# Patient Record
Sex: Female | Born: 1950 | Race: White | Hispanic: No
Health system: Southern US, Community
[De-identification: ages and names within clinical notes are randomized; demographics above are authoritative.]

## PROBLEM LIST (undated history)

## (undated) DIAGNOSIS — K7581 Nonalcoholic steatohepatitis (NASH): Secondary | ICD-10-CM

## (undated) DIAGNOSIS — I482 Chronic atrial fibrillation, unspecified: Secondary | ICD-10-CM

## (undated) DIAGNOSIS — J9621 Acute and chronic respiratory failure with hypoxia: Secondary | ICD-10-CM

## (undated) DIAGNOSIS — G9341 Metabolic encephalopathy: Secondary | ICD-10-CM

---

## 2020-07-19 ENCOUNTER — Ambulatory Visit (HOSPITAL_COMMUNITY)
Admission: AD | Admit: 2020-07-19 | Discharge: 2020-07-19 | Disposition: A | Discharge status: Home / Self Care | Payer: Medicare Other | Source: Other Acute Inpatient Hospital | Attending: Internal Medicine | Admitting: Internal Medicine

## 2020-07-19 ENCOUNTER — Other Ambulatory Visit (HOSPITAL_COMMUNITY): Payer: Medicare Other

## 2020-07-19 ENCOUNTER — Inpatient Hospital Stay
Admission: RE | Admit: 2020-07-19 | Discharge: 2020-08-03 | Disposition: A | Discharge status: Other Acute Inpatient Hospital | Payer: Medicare Other | Source: Other Acute Inpatient Hospital | Attending: Internal Medicine | Admitting: Internal Medicine

## 2020-07-19 DIAGNOSIS — Z452 Encounter for adjustment and management of vascular access device: Secondary | ICD-10-CM

## 2020-07-19 DIAGNOSIS — J9621 Acute and chronic respiratory failure with hypoxia: Secondary | ICD-10-CM | POA: Diagnosis present

## 2020-07-19 DIAGNOSIS — J9690 Respiratory failure, unspecified, unspecified whether with hypoxia or hypercapnia: Secondary | ICD-10-CM

## 2020-07-19 DIAGNOSIS — I482 Chronic atrial fibrillation, unspecified: Secondary | ICD-10-CM | POA: Diagnosis present

## 2020-07-19 DIAGNOSIS — D72829 Elevated white blood cell count, unspecified: Secondary | ICD-10-CM

## 2020-07-19 DIAGNOSIS — Z9289 Personal history of other medical treatment: Secondary | ICD-10-CM

## 2020-07-19 DIAGNOSIS — G9341 Metabolic encephalopathy: Secondary | ICD-10-CM | POA: Diagnosis present

## 2020-07-19 DIAGNOSIS — Z4659 Encounter for fitting and adjustment of other gastrointestinal appliance and device: Secondary | ICD-10-CM

## 2020-07-19 DIAGNOSIS — J189 Pneumonia, unspecified organism: Secondary | ICD-10-CM

## 2020-07-19 DIAGNOSIS — K7581 Nonalcoholic steatohepatitis (NASH): Secondary | ICD-10-CM | POA: Diagnosis present

## 2020-07-19 HISTORY — DX: Chronic atrial fibrillation, unspecified: I48.20

## 2020-07-19 HISTORY — DX: Acute and chronic respiratory failure with hypoxia: J96.21

## 2020-07-19 HISTORY — DX: Nonalcoholic steatohepatitis (NASH): K75.81

## 2020-07-19 HISTORY — DX: Metabolic encephalopathy: G93.41

## 2020-07-20 DIAGNOSIS — K7581 Nonalcoholic steatohepatitis (NASH): Secondary | ICD-10-CM | POA: Diagnosis not present

## 2020-07-20 DIAGNOSIS — J9621 Acute and chronic respiratory failure with hypoxia: Secondary | ICD-10-CM | POA: Diagnosis not present

## 2020-07-20 DIAGNOSIS — I482 Chronic atrial fibrillation, unspecified: Secondary | ICD-10-CM

## 2020-07-20 DIAGNOSIS — G9341 Metabolic encephalopathy: Secondary | ICD-10-CM

## 2020-07-20 LAB — BLOOD GAS, ARTERIAL
Acid-Base Excess: 9.5 mmol/L — ABNORMAL HIGH (ref 0.0–2.0)
Bicarbonate: 33.8 mmol/L — ABNORMAL HIGH (ref 20.0–28.0)
FIO2: 28
O2 Saturation: 96.5 %
Patient temperature: 37
pCO2 arterial: 48.8 mmHg — ABNORMAL HIGH (ref 32.0–48.0)
pH, Arterial: 7.455 — ABNORMAL HIGH (ref 7.350–7.450)
pO2, Arterial: 80.9 mmHg — ABNORMAL LOW (ref 83.0–108.0)

## 2020-07-20 LAB — CBC
HCT: 36.6 % (ref 36.0–46.0)
Hemoglobin: 10.9 g/dL — ABNORMAL LOW (ref 12.0–15.0)
MCH: 28.1 pg (ref 26.0–34.0)
MCHC: 29.8 g/dL — ABNORMAL LOW (ref 30.0–36.0)
MCV: 94.3 fL (ref 80.0–100.0)
Platelets: 82 10*3/uL — ABNORMAL LOW (ref 150–400)
RBC: 3.88 MIL/uL (ref 3.87–5.11)
RDW: 18.8 % — ABNORMAL HIGH (ref 11.5–15.5)
WBC: 5.9 10*3/uL (ref 4.0–10.5)
nRBC: 0 % (ref 0.0–0.2)

## 2020-07-20 LAB — COMPREHENSIVE METABOLIC PANEL
ALT: 98 U/L — ABNORMAL HIGH (ref 0–44)
AST: 121 U/L — ABNORMAL HIGH (ref 15–41)
Albumin: 2.6 g/dL — ABNORMAL LOW (ref 3.5–5.0)
Alkaline Phosphatase: 165 U/L — ABNORMAL HIGH (ref 38–126)
Anion gap: 10 (ref 5–15)
BUN: 37 mg/dL — ABNORMAL HIGH (ref 8–23)
CO2: 30 mmol/L (ref 22–32)
Calcium: 8.9 mg/dL (ref 8.9–10.3)
Chloride: 108 mmol/L (ref 98–111)
Creatinine, Ser: 0.91 mg/dL (ref 0.44–1.00)
GFR calc Af Amer: >60 mL/min (ref >60)
GFR calc non Af Amer: >60 mL/min (ref >60)
Glucose, Bld: 323 mg/dL — ABNORMAL HIGH (ref 70–99)
Potassium: 3.9 mmol/L (ref 3.5–5.1)
Sodium: 148 mmol/L — ABNORMAL HIGH (ref 135–145)
Total Bilirubin: 1.4 mg/dL — ABNORMAL HIGH (ref 0.3–1.2)
Total Protein: 5.2 g/dL — ABNORMAL LOW (ref 6.5–8.1)

## 2020-07-20 LAB — AMMONIA: Ammonia: 63 umol/L — ABNORMAL HIGH (ref 9–35)

## 2020-07-20 LAB — MAGNESIUM: Magnesium: 1.7 mg/dL (ref 1.7–2.4)

## 2020-07-21 ENCOUNTER — Encounter: Payer: Self-pay | Admitting: Internal Medicine

## 2020-07-21 DIAGNOSIS — K7581 Nonalcoholic steatohepatitis (NASH): Secondary | ICD-10-CM | POA: Diagnosis present

## 2020-07-21 DIAGNOSIS — I482 Chronic atrial fibrillation, unspecified: Secondary | ICD-10-CM | POA: Diagnosis present

## 2020-07-21 DIAGNOSIS — G9341 Metabolic encephalopathy: Secondary | ICD-10-CM | POA: Diagnosis not present

## 2020-07-21 DIAGNOSIS — J9621 Acute and chronic respiratory failure with hypoxia: Secondary | ICD-10-CM | POA: Diagnosis not present

## 2020-07-21 LAB — BASIC METABOLIC PANEL
Anion gap: 10 (ref 5–15)
BUN: 35 mg/dL — ABNORMAL HIGH (ref 8–23)
CO2: 34 mmol/L — ABNORMAL HIGH (ref 22–32)
Calcium: 9.1 mg/dL (ref 8.9–10.3)
Chloride: 106 mmol/L (ref 98–111)
Creatinine, Ser: 1.02 mg/dL — ABNORMAL HIGH (ref 0.44–1.00)
GFR calc Af Amer: >60 mL/min (ref >60)
GFR calc non Af Amer: 55 mL/min — ABNORMAL LOW (ref >60)
Glucose, Bld: 309 mg/dL — ABNORMAL HIGH (ref 70–99)
Potassium: 3.3 mmol/L — ABNORMAL LOW (ref 3.5–5.1)
Sodium: 150 mmol/L — ABNORMAL HIGH (ref 135–145)

## 2020-07-21 LAB — URINALYSIS, ROUTINE W REFLEX MICROSCOPIC
Bacteria, UA: NONE SEEN
Bilirubin Urine: NEGATIVE
Glucose, UA: 50 mg/dL — AB
Hgb urine dipstick: NEGATIVE
Ketones, ur: NEGATIVE mg/dL
Nitrite: NEGATIVE
Protein, ur: 100 mg/dL — AB
Specific Gravity, Urine: 1.018 (ref 1.005–1.030)
pH: 5 (ref 5.0–8.0)

## 2020-07-21 LAB — CBC
HCT: 38.7 % (ref 36.0–46.0)
Hemoglobin: 11.2 g/dL — ABNORMAL LOW (ref 12.0–15.0)
MCH: 27.4 pg (ref 26.0–34.0)
MCHC: 28.9 g/dL — ABNORMAL LOW (ref 30.0–36.0)
MCV: 94.6 fL (ref 80.0–100.0)
Platelets: 187 10*3/uL (ref 150–400)
RBC: 4.09 MIL/uL (ref 3.87–5.11)
RDW: 19.1 % — ABNORMAL HIGH (ref 11.5–15.5)
WBC: 11.7 10*3/uL — ABNORMAL HIGH (ref 4.0–10.5)
nRBC: 0.6 % — ABNORMAL HIGH (ref 0.0–0.2)

## 2020-07-21 LAB — MAGNESIUM: Magnesium: 1.9 mg/dL (ref 1.7–2.4)

## 2020-07-21 LAB — TSH: TSH: 1.126 u[IU]/mL (ref 0.350–4.500)

## 2020-07-21 NOTE — Progress Notes (Signed)
Pulmonary Critical Care Medicine [facility] [place]   PULMONARY CRITICAL CARE SERVICE  PROGRESS NOTE  Date of Service: 07/21/2020  Shondrea Steinert  ZJQ:[id number]  DOB: 09/07/1951   DOA: 07/19/2020  Referring Physician: [name], MD  HPI: Alahna Dunne is a 69 y.o. female seen for follow up of Acute on Chronic Respiratory Failure.  Patient at this time is on full support has had fever so therefore weaning has been on hold  Medications: Reviewed on Rounds  Physical Exam:  Vitals: Temperature is 98.3 pulse 103 respiratory 21 blood pressure is 127/74 saturations 100%  Ventilator Settings on assist control FiO2 is 28% tidal line is 410 PEEP 5  . General: Comfortable at this time . Eyes: Grossly normal lids, irises & conjunctiva . ENT: grossly tongue is normal . Neck: no obvious mass . Cardiovascular: S1 S2 normal no gallop . Respiratory: Scattered rhonchi very coarse breath sounds . Abdomen: soft . Skin: no rash seen on limited exam . Musculoskeletal: not rigid . Psychiatric:unable to assess . Neurologic: no seizure no involuntary movements         Lab Data:   Basic Metabolic Panel: Recent Labs  Lab 07/20/20 1349 07/21/20 0954  NA 148* 150*  K 3.9 3.3*  CL 108 106  CO2 30 34*  GLUCOSE 323* 309*  BUN 37* 35*  CREATININE 0.91 1.02*  CALCIUM 8.9 9.1  MG 1.7 1.9    ABG: Recent Labs  Lab 07/20/20 0058  PHART 7.455*  PCO2ART 48.8*  PO2ART 80.9*  HCO3 33.8*  O2SAT 96.5    Liver Function Tests: Recent Labs  Lab 07/20/20 1349  AST 121*  ALT 98*  ALKPHOS 165*  BILITOT 1.4*  PROT 5.2*  ALBUMIN 2.6*   No results for input(s): LIPASE, AMYLASE in the last 168 hours. Recent Labs  Lab 07/20/20 1349  AMMONIA 63*    CBC: Recent Labs  Lab 07/20/20 1544 07/21/20 0954  WBC 5.9 11.7*  HGB 10.9* 11.2*  HCT 36.6 38.7  MCV 94.3 94.6  PLT 82* 187    Cardiac Enzymes: No results for input(s): CKTOTAL, CKMB, CKMBINDEX, TROPONINI in the last 168  hours.  BNP (last 3 results) No results for input(s): BNP in the last 8760 hours.  ProBNP (last 3 results) No results for input(s): PROBNP in the last 8760 hours.  Radiological Exams: DG CHEST PORT 1 VIEW  Result Date: 07/19/2020 CLINICAL DATA:  History of endotracheal tube. EXAM: PORTABLE CHEST 1 VIEW COMPARISON:  Radiograph 07/17/2020 FINDINGS: Endotracheal tube tip 2 cm from the carina. Tip and side port of the enteric tube below the diaphragm in the stomach. Elevation of right hemidiaphragm appears similar to prior exam. Patchy and hazy bilateral lung base opacities may represent atelectasis, pneumonia, pleural effusion or combination there of. Stable heart size and mediastinal contours. Aortic tortuosity. No pneumothorax. Multiple overlying monitoring devices. IMPRESSION: 1. Endotracheal tube tip 2 cm from the carina. Enteric tube in place. 2. Patchy and hazy bilateral lung base opacities may represent atelectasis, pneumonia, pleural effusion or combination of there of. Electronically Signed   By: Narda Rutherford M.D.   On: 07/19/2020 23:54   DG Abd Portable 1V  Result Date: 07/19/2020 CLINICAL DATA:  Nasogastric tube placement. EXAM: PORTABLE ABDOMEN - 1 VIEW COMPARISON:  Radiograph 07/09/2020 FINDINGS: Tip and side port of the enteric tube below the diaphragm in the stomach. There is gaseous distension of transverse colon. No evidence of free air. IMPRESSION: Tip and side port of the enteric tube  below the diaphragm in the stomach. Mild gaseous distension of transverse colon. Electronically Signed   By: Narda Rutherford M.D.   On: 07/19/2020 23:52    Assessment/Plan Active Problems:   Acute on chronic respiratory failure with hypoxia (HCC)   Chronic atrial fibrillation (HCC)   Metabolic encephalopathy   NASH (nonalcoholic steatohepatitis)   1. Acute on chronic respiratory failure hypoxia we will continue with full support on assist control Currently is on 28% FiO2 with a PEEP of 5  patient scheduled fevers are therefore will be holding weaning today 2. Chronic atrial fibrillation rate controlled 3. Metabolic encephalopathy no change we will continue to follow 4. Nash at baseline we will monitor   I have personally seen and evaluated the patient, evaluated laboratory and imaging results, formulated the assessment and plan and placed orders. The Patient requires high complexity decision making with multiple systems involvement.  Rounds were done with the Respiratory Therapy Director and Staff therapists and discussed with nursing staff also.  Yevonne Pax, MD [facility] Pulmonary Critical Care Medicine Sleep Medicine
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
## 2020-07-22 DIAGNOSIS — J9621 Acute and chronic respiratory failure with hypoxia: Secondary | ICD-10-CM | POA: Diagnosis not present

## 2020-07-22 DIAGNOSIS — I482 Chronic atrial fibrillation, unspecified: Secondary | ICD-10-CM | POA: Diagnosis not present

## 2020-07-22 DIAGNOSIS — K7581 Nonalcoholic steatohepatitis (NASH): Secondary | ICD-10-CM | POA: Diagnosis not present

## 2020-07-22 DIAGNOSIS — G9341 Metabolic encephalopathy: Secondary | ICD-10-CM | POA: Diagnosis not present

## 2020-07-22 LAB — BASIC METABOLIC PANEL
Anion gap: 7 (ref 5–15)
BUN: 36 mg/dL — ABNORMAL HIGH (ref 8–23)
CO2: 29 mmol/L (ref 22–32)
Calcium: 7.6 mg/dL — ABNORMAL LOW (ref 8.9–10.3)
Chloride: 111 mmol/L (ref 98–111)
Creatinine, Ser: 1.06 mg/dL — ABNORMAL HIGH (ref 0.44–1.00)
GFR calc Af Amer: >60 mL/min (ref >60)
GFR calc non Af Amer: 53 mL/min — ABNORMAL LOW (ref >60)
Glucose, Bld: 314 mg/dL — ABNORMAL HIGH (ref 70–99)
Potassium: 5.2 mmol/L — ABNORMAL HIGH (ref 3.5–5.1)
Sodium: 147 mmol/L — ABNORMAL HIGH (ref 135–145)

## 2020-07-22 NOTE — Progress Notes (Signed)
Pulmonary Critical Care Medicine [facility] [place]   PULMONARY CRITICAL CARE SERVICE  PROGRESS NOTE  Date of Service: 07/22/2020  Emily Ho  FBP:[id number]  DOB: 1951-10-17   DOA: 07/19/2020  Referring Physician: [name], MD  HPI: Emily Ho is a 69 y.o. female seen for follow up of Acute on Chronic Respiratory Failure.  Currently is on assist control mode has been on 28% FiO2 with good saturations  Medications: Reviewed on Rounds  Physical Exam:  Vitals: Temperature is 98.8 pulse 120 respiratory rate is 16 blood pressure is 142/65 saturations 96%  Ventilator Settings patient currently is on assist control FiO2 28% tidal volume 410 PEEP 5  . General: Comfortable at this time . Eyes: Grossly normal lids, irises & conjunctiva . ENT: grossly tongue is normal . Neck: no obvious mass . Cardiovascular: S1 S2 normal no gallop . Respiratory: No rhonchi very coarse breath sounds . Abdomen: soft . Skin: no rash seen on limited exam . Musculoskeletal: not rigid . Psychiatric:unable to assess . Neurologic: no seizure no involuntary movements         Lab Data:   Basic Metabolic Panel: Recent Labs  Lab 07/20/20 1349 07/21/20 0954  NA 148* 150*  K 3.9 3.3*  CL 108 106  CO2 30 34*  GLUCOSE 323* 309*  BUN 37* 35*  CREATININE 0.91 1.02*  CALCIUM 8.9 9.1  MG 1.7 1.9    ABG: Recent Labs  Lab 07/20/20 0058  PHART 7.455*  PCO2ART 48.8*  PO2ART 80.9*  HCO3 33.8*  O2SAT 96.5    Liver Function Tests: Recent Labs  Lab 07/20/20 1349  AST 121*  ALT 98*  ALKPHOS 165*  BILITOT 1.4*  PROT 5.2*  ALBUMIN 2.6*   No results for input(s): LIPASE, AMYLASE in the last 168 hours. Recent Labs  Lab 07/20/20 1349  AMMONIA 63*    CBC: Recent Labs  Lab 07/20/20 1544 07/21/20 0954  WBC 5.9 11.7*  HGB 10.9* 11.2*  HCT 36.6 38.7  MCV 94.3 94.6  PLT 82* 187    Cardiac Enzymes: No results for input(s): CKTOTAL, CKMB, CKMBINDEX, TROPONINI in the last  168 hours.  BNP (last 3 results) No results for input(s): BNP in the last 8760 hours.  ProBNP (last 3 results) No results for input(s): PROBNP in the last 8760 hours.  Radiological Exams: No results found.  Assessment/Plan Active Problems:   Acute on chronic respiratory failure with hypoxia (HCC)   Chronic atrial fibrillation (HCC)   Metabolic encephalopathy   NASH (nonalcoholic steatohepatitis)   1. Acute on chronic respiratory failure hypoxia we will continue with full support on assist control titrate oxygen continue pulmonary toilet. 2. Chronic atrial fibrillation rate is controlled at this time 3. Metabolic encephalopathy no change 4. Emily Ho -Supportive care   I have personally seen and evaluated the patient, evaluated laboratory and imaging results, formulated the assessment and plan and placed orders. The Patient requires high complexity decision making with multiple systems involvement.  Rounds were done with the Respiratory Therapy Director and Staff therapists and discussed with nursing staff also.  Yevonne Pax, MD [facility] Pulmonary Critical Care Medicine Sleep Medicine

## 2020-07-23 DIAGNOSIS — I482 Chronic atrial fibrillation, unspecified: Secondary | ICD-10-CM | POA: Diagnosis not present

## 2020-07-23 DIAGNOSIS — K7581 Nonalcoholic steatohepatitis (NASH): Secondary | ICD-10-CM | POA: Diagnosis not present

## 2020-07-23 DIAGNOSIS — J9621 Acute and chronic respiratory failure with hypoxia: Secondary | ICD-10-CM | POA: Diagnosis not present

## 2020-07-23 DIAGNOSIS — G9341 Metabolic encephalopathy: Secondary | ICD-10-CM | POA: Diagnosis not present

## 2020-07-23 LAB — BASIC METABOLIC PANEL
Anion gap: 8 (ref 5–15)
BUN: 34 mg/dL — ABNORMAL HIGH (ref 8–23)
CO2: 31 mmol/L (ref 22–32)
Calcium: 8.4 mg/dL — ABNORMAL LOW (ref 8.9–10.3)
Chloride: 107 mmol/L (ref 98–111)
Creatinine, Ser: 0.96 mg/dL (ref 0.44–1.00)
GFR calc Af Amer: >60 mL/min (ref >60)
GFR calc non Af Amer: 60 mL/min — ABNORMAL LOW (ref >60)
Glucose, Bld: 327 mg/dL — ABNORMAL HIGH (ref 70–99)
Potassium: 4.3 mmol/L (ref 3.5–5.1)
Sodium: 146 mmol/L — ABNORMAL HIGH (ref 135–145)

## 2020-07-23 LAB — CBC
HCT: 33.7 % — ABNORMAL LOW (ref 36.0–46.0)
Hemoglobin: 9.6 g/dL — ABNORMAL LOW (ref 12.0–15.0)
MCH: 27 pg (ref 26.0–34.0)
MCHC: 28.5 g/dL — ABNORMAL LOW (ref 30.0–36.0)
MCV: 94.9 fL (ref 80.0–100.0)
Platelets: 130 10*3/uL — ABNORMAL LOW (ref 150–400)
RBC: 3.55 MIL/uL — ABNORMAL LOW (ref 3.87–5.11)
RDW: 19 % — ABNORMAL HIGH (ref 11.5–15.5)
WBC: 10.4 10*3/uL (ref 4.0–10.5)
nRBC: 0.5 % — ABNORMAL HIGH (ref 0.0–0.2)

## 2020-07-23 NOTE — Progress Notes (Signed)
Pulmonary Critical Care Medicine [facility] [place]   PULMONARY CRITICAL CARE SERVICE  PROGRESS NOTE  Date of Service: 07/23/2020  Emily Ho  SJG:[id number]  DOB: December 10, 1950   DOA: 07/19/2020  Referring Physician: [name], MD  HPI: Emily Ho is a 69 y.o. female seen for follow up of Acute on Chronic Respiratory Failure.  Patient currently is on assist control has been on 28% FiO2 with good saturations secretions have been fairly moderate  Medications: Reviewed on Rounds  Physical Exam:  Vitals: Temperature is 98.8 pulse 138 respiratory rate is 18 blood pressure is 186/88 saturations 98%  Ventilator Settings on assist control FiO2 28% tidal volume is 410 PEEP 5  . General: Comfortable at this time . Eyes: Grossly normal lids, irises & conjunctiva . ENT: grossly tongue is normal . Neck: no obvious mass . Cardiovascular: S1 S2 normal no gallop . Respiratory: No rhonchi no rales are noted at this time . Abdomen: soft . Skin: no rash seen on limited exam . Musculoskeletal: not rigid . Psychiatric:unable to assess . Neurologic: no seizure no involuntary movements         Lab Data:   Basic Metabolic Panel: Recent Labs  Lab 07/20/20 1349 07/21/20 0954 07/22/20 1844  NA 148* 150* 147*  K 3.9 3.3* 5.2*  CL 108 106 111  CO2 30 34* 29  GLUCOSE 323* 309* 314*  BUN 37* 35* 36*  CREATININE 0.91 1.02* 1.06*  CALCIUM 8.9 9.1 7.6*  MG 1.7 1.9  --     ABG: Recent Labs  Lab 07/20/20 0058  PHART 7.455*  PCO2ART 48.8*  PO2ART 80.9*  HCO3 33.8*  O2SAT 96.5    Liver Function Tests: Recent Labs  Lab 07/20/20 1349  AST 121*  ALT 98*  ALKPHOS 165*  BILITOT 1.4*  PROT 5.2*  ALBUMIN 2.6*   No results for input(s): LIPASE, AMYLASE in the last 168 hours. Recent Labs  Lab 07/20/20 1349  AMMONIA 63*    CBC: Recent Labs  Lab 07/20/20 1544 07/21/20 0954  WBC 5.9 11.7*  HGB 10.9* 11.2*  HCT 36.6 38.7  MCV 94.3 94.6  PLT 82* 187    Cardiac  Enzymes: No results for input(s): CKTOTAL, CKMB, CKMBINDEX, TROPONINI in the last 168 hours.  BNP (last 3 results) No results for input(s): BNP in the last 8760 hours.  ProBNP (last 3 results) No results for input(s): PROBNP in the last 8760 hours.  Radiological Exams: No results found.  Assessment/Plan Active Problems:   Acute on chronic respiratory failure with hypoxia (HCC)   Chronic atrial fibrillation (HCC)   Metabolic encephalopathy   NASH (nonalcoholic steatohepatitis)   1. Acute on chronic respiratory failure with hypoxia we will continue to assess wean readiness.  Check RSB I mechanics and try to wean today. 2. Chronic atrial fibrillation rate is controlled and I will continue to monitor 3. Metabolic encephalopathy no change 4. -Emily Ho continue with supportive care   I have personally seen and evaluated the patient, evaluated laboratory and imaging results, formulated the assessment and plan and placed orders. The Patient requires high complexity decision making with multiple systems involvement.  Rounds were done with the Respiratory Therapy Director and Staff therapists and discussed with nursing staff also.  Yevonne Pax, MD [facility] Pulmonary Critical Care Medicine Sleep Medicine

## 2020-07-24 DIAGNOSIS — G9341 Metabolic encephalopathy: Secondary | ICD-10-CM | POA: Diagnosis not present

## 2020-07-24 DIAGNOSIS — J9621 Acute and chronic respiratory failure with hypoxia: Secondary | ICD-10-CM | POA: Diagnosis not present

## 2020-07-24 DIAGNOSIS — K7581 Nonalcoholic steatohepatitis (NASH): Secondary | ICD-10-CM | POA: Diagnosis not present

## 2020-07-24 DIAGNOSIS — I482 Chronic atrial fibrillation, unspecified: Secondary | ICD-10-CM | POA: Diagnosis not present

## 2020-07-24 LAB — BASIC METABOLIC PANEL
Anion gap: 7 (ref 5–15)
BUN: 34 mg/dL — ABNORMAL HIGH (ref 8–23)
CO2: 33 mmol/L — ABNORMAL HIGH (ref 22–32)
Calcium: 8.6 mg/dL — ABNORMAL LOW (ref 8.9–10.3)
Chloride: 104 mmol/L (ref 98–111)
Creatinine, Ser: 0.87 mg/dL (ref 0.44–1.00)
GFR calc Af Amer: >60 mL/min (ref >60)
GFR calc non Af Amer: >60 mL/min (ref >60)
Glucose, Bld: 260 mg/dL — ABNORMAL HIGH (ref 70–99)
Potassium: 4.5 mmol/L (ref 3.5–5.1)
Sodium: 144 mmol/L (ref 135–145)

## 2020-07-24 LAB — AMMONIA: Ammonia: 34 umol/L (ref 9–35)

## 2020-07-24 NOTE — Progress Notes (Addendum)
Pulmonary Critical Care Medicine [facility] [place]   PULMONARY CRITICAL CARE SERVICE  PROGRESS NOTE  Date of Service: 07/24/2020  [name]  HUD:[id number]  DOB: March 27, 1951   DOA: 07/19/2020  Referring Physician: [name], MD  HPI: Emily Ho is a 69 y.o. female seen for follow up of Acute on Chronic Respiratory Failure.  Patient continues on full support assist-control mode rate 16 with an FiO2 of 28% currently satting well no distress.  Medications: Reviewed on Rounds  Physical Exam:  Vitals: Pulse 73 respirations 26 BP 144/89 O2 sat 100% temp 96.7  Ventilator Settings ventilator mode AC VC plus rate of 16 tidal volume 410 PEEP of 5 and FiO2 of 28%  . General: Comfortable at this time . Eyes: Grossly normal lids, irises & conjunctiva . ENT: grossly tongue is normal . Neck: no obvious mass . Cardiovascular: S1 S2 normal no gallop . Respiratory: Coarse breath sounds . Abdomen: soft . Skin: no rash seen on limited exam . Musculoskeletal: not rigid . Psychiatric:unable to assess . Neurologic: no seizure no involuntary movements         Lab Data:   Basic Metabolic Panel: Recent Labs  Lab 07/20/20 1349 07/21/20 0954 07/22/20 1844 07/23/20 1100 07/24/20 0904  NA 148* 150* 147* 146* 144  K 3.9 3.3* 5.2* 4.3 4.5  CL 108 106 111 107 104  CO2 30 34* 29 31 33*  GLUCOSE 323* 309* 314* 327* 260*  BUN 37* 35* 36* 34* 34*  CREATININE 0.91 1.02* 1.06* 0.96 0.87  CALCIUM 8.9 9.1 7.6* 8.4* 8.6*  MG 1.7 1.9  --   --   --     ABG: Recent Labs  Lab 07/20/20 0058  PHART 7.455*  PCO2ART 48.8*  PO2ART 80.9*  HCO3 33.8*  O2SAT 96.5    Liver Function Tests: Recent Labs  Lab 07/20/20 1349  AST 121*  ALT 98*  ALKPHOS 165*  BILITOT 1.4*  PROT 5.2*  ALBUMIN 2.6*   No results for input(s): LIPASE, AMYLASE in the last 168 hours. Recent Labs  Lab 07/20/20 1349 07/24/20 0904  AMMONIA 63* 34    CBC: Recent Labs  Lab 07/20/20 1544  07/21/20 0954 07/23/20 1100  WBC 5.9 11.7* 10.4  HGB 10.9* 11.2* 9.6*  HCT 36.6 38.7 33.7*  MCV 94.3 94.6 94.9  PLT 82* 187 130*    Cardiac Enzymes: No results for input(s): CKTOTAL, CKMB, CKMBINDEX, TROPONINI in the last 168 hours.  BNP (last 3 results) No results for input(s): BNP in the last 8760 hours.  ProBNP (last 3 results) No results for input(s): PROBNP in the last 8760 hours.  Radiological Exams: No results found.  Assessment/Plan Active Problems:   Acute on chronic respiratory failure with hypoxia (HCC)   Chronic atrial fibrillation (HCC)   Metabolic encephalopathy   NASH (nonalcoholic steatohepatitis)   1. Acute on chronic respiratory failure with hypoxia patient remains on full support will continue with current settings.  We will continue to wean as tolerated.  Continue aggressive pulmonary toilet supportive measures. 2. Chronic atrial fibrillation rate is controlled and I will continue to monitor 3. Metabolic encephalopathy no change 4. -[name] continue with supportive care   I have personally seen and evaluated the patient, evaluated laboratory and imaging results, formulated the assessment and plan and placed orders. The Patient requires high complexity decision making with multiple systems involvement.  Rounds were done with the Respiratory Therapy Director and Staff therapists and discussed with nursing staff also.  Leydi Winstead A  Humphrey Rolls, MD [facility] Pulmonary Critical Care Medicine Sleep Medicine

## 2020-07-25 DIAGNOSIS — I482 Chronic atrial fibrillation, unspecified: Secondary | ICD-10-CM | POA: Diagnosis not present

## 2020-07-25 DIAGNOSIS — K7581 Nonalcoholic steatohepatitis (NASH): Secondary | ICD-10-CM | POA: Diagnosis not present

## 2020-07-25 DIAGNOSIS — G9341 Metabolic encephalopathy: Secondary | ICD-10-CM | POA: Diagnosis not present

## 2020-07-25 DIAGNOSIS — J9621 Acute and chronic respiratory failure with hypoxia: Secondary | ICD-10-CM | POA: Diagnosis not present

## 2020-07-25 NOTE — Progress Notes (Addendum)
Pulmonary Critical Care Medicine [facility] [place]   PULMONARY CRITICAL CARE SERVICE  PROGRESS NOTE  Date of Service: 07/25/2020  Emily Ho  VVO:[id number]  DOB: Jul 02, 1951   DOA: 07/19/2020  Referring Physician: [name], MD  HPI: Emily Ho is a 69 y.o. female seen for follow up of Acute on Chronic Respiratory Failure. Patient continues on full support rate of 16 with an FiO2 of 28% satting well with no distress.  Medications: Reviewed on Rounds  Physical Exam:  Vitals: Pulse 89 respirations 20 BP 116/55 O2 sat 100% temp 97.2  Ventilator Settings ventilator mode AC VC plus rate of 16 tidal volume 450 PEEP of five and FiO2 of 28%  . General: Comfortable at this time . Eyes: Grossly normal lids, irises & conjunctiva . ENT: grossly tongue is normal . Neck: no obvious mass . Cardiovascular: S1 S2 normal no gallop . Respiratory: Coarse breath sounds . Abdomen: soft . Skin: no rash seen on limited exam . Musculoskeletal: not rigid . Psychiatric:unable to assess . Neurologic: no seizure no involuntary movements         Lab Data:   Basic Metabolic Panel: Recent Labs  Lab 07/20/20 1349 07/21/20 0954 07/22/20 1844 07/23/20 1100 07/24/20 0904  NA 148* 150* 147* 146* 144  K 3.9 3.3* 5.2* 4.3 4.5  CL 108 106 111 107 104  CO2 30 34* 29 31 33*  GLUCOSE 323* 309* 314* 327* 260*  BUN 37* 35* 36* 34* 34*  CREATININE 0.91 1.02* 1.06* 0.96 0.87  CALCIUM 8.9 9.1 7.6* 8.4* 8.6*  MG 1.7 1.9  --   --   --     ABG: Recent Labs  Lab 07/20/20 0058  PHART 7.455*  PCO2ART 48.8*  PO2ART 80.9*  HCO3 33.8*  O2SAT 96.5    Liver Function Tests: Recent Labs  Lab 07/20/20 1349  AST 121*  ALT 98*  ALKPHOS 165*  BILITOT 1.4*  PROT 5.2*  ALBUMIN 2.6*   No results for input(s): LIPASE, AMYLASE in the last 168 hours. Recent Labs  Lab 07/20/20 1349 07/24/20 0904  AMMONIA 63* 34    CBC: Recent Labs  Lab 07/20/20 1544 07/21/20 0954 07/23/20 1100   WBC 5.9 11.7* 10.4  HGB 10.9* 11.2* 9.6*  HCT 36.6 38.7 33.7*  MCV 94.3 94.6 94.9  PLT 82* 187 130*    Cardiac Enzymes: No results for input(s): CKTOTAL, CKMB, CKMBINDEX, TROPONINI in the last 168 hours.  BNP (last 3 results) No results for input(s): BNP in the last 8760 hours.  ProBNP (last 3 results) No results for input(s): PROBNP in the last 8760 hours.  Radiological Exams: No results found.  Assessment/Plan Active Problems:   Acute on chronic respiratory failure with hypoxia (HCC)   Chronic atrial fibrillation (HCC)   Metabolic encephalopathy   NASH (nonalcoholic steatohepatitis)   1. Acute on chronic respiratory failure with hypoxia patient remains on full support will continue with current settings.  We will continue to wean as tolerated.  Continue aggressive pulmonary toilet supportive measures. 2. Chronic atrial fibrillation rate is controlled and I will continue to monitor 3. Metabolic encephalopathy no change 4. -[name] continue with supportive care   I have personally seen and evaluated the patient, evaluated laboratory and imaging results, formulated the assessment and plan and placed orders. The Patient requires high complexity decision making with multiple systems involvement.  Rounds were done with the Respiratory Therapy Director and Staff therapists and discussed with nursing staff also.  Yevonne Pax, MD  [facility] Pulmonary Critical Care Medicine Sleep Medicine

## 2020-07-26 DIAGNOSIS — G9341 Metabolic encephalopathy: Secondary | ICD-10-CM | POA: Diagnosis not present

## 2020-07-26 DIAGNOSIS — J9621 Acute and chronic respiratory failure with hypoxia: Secondary | ICD-10-CM | POA: Diagnosis not present

## 2020-07-26 DIAGNOSIS — I482 Chronic atrial fibrillation, unspecified: Secondary | ICD-10-CM | POA: Diagnosis not present

## 2020-07-26 DIAGNOSIS — K7581 Nonalcoholic steatohepatitis (NASH): Secondary | ICD-10-CM | POA: Diagnosis not present

## 2020-07-26 NOTE — Progress Notes (Addendum)
Pulmonary Critical Care Medicine [facility] [place]   PULMONARY CRITICAL CARE SERVICE  PROGRESS NOTE  Date of Service: 07/26/2020  Emily Ho  NLG:[id number]  DOB: 11-07-1950   DOA: 07/19/2020  Referring Physician: [name], MD  HPI: Emily Ho is a 69 y.o. female seen for follow up of Acute on Chronic Respiratory Failure.  Patient continues on full support assist-control rate of 16 with an FiO2 of 28% currently.  No fever or distress noted.  Medications: Reviewed on Rounds  Physical Exam:  Vitals: Pulse 65 respirations 23 BP 127/69 O2 sat 100% temp 96.5  Ventilator Settings ventilator mode AC VC plus rate of 16 tidal 0.14 PEEP of 5 and FiO2 of 28%  . General: Comfortable at this time . Eyes: Grossly normal lids, irises & conjunctiva . ENT: grossly tongue is normal . Neck: no obvious mass . Cardiovascular: S1 S2 normal no gallop . Respiratory: No rales or rhonchi noted . Abdomen: soft . Skin: no rash seen on limited exam . Musculoskeletal: not rigid . Psychiatric:unable to assess . Neurologic: no seizure no involuntary movements         Lab Data:   Basic Metabolic Panel: Recent Labs  Lab 07/20/20 1349 07/21/20 0954 07/22/20 1844 07/23/20 1100 07/24/20 0904  NA 148* 150* 147* 146* 144  K 3.9 3.3* 5.2* 4.3 4.5  CL 108 106 111 107 104  CO2 30 34* 29 31 33*  GLUCOSE 323* 309* 314* 327* 260*  BUN 37* 35* 36* 34* 34*  CREATININE 0.91 1.02* 1.06* 0.96 0.87  CALCIUM 8.9 9.1 7.6* 8.4* 8.6*  MG 1.7 1.9  --   --   --     ABG: Recent Labs  Lab 07/20/20 0058  PHART 7.455*  PCO2ART 48.8*  PO2ART 80.9*  HCO3 33.8*  O2SAT 96.5    Liver Function Tests: Recent Labs  Lab 07/20/20 1349  AST 121*  ALT 98*  ALKPHOS 165*  BILITOT 1.4*  PROT 5.2*  ALBUMIN 2.6*   No results for input(s): LIPASE, AMYLASE in the last 168 hours. Recent Labs  Lab 07/20/20 1349 07/24/20 0904  AMMONIA 63* 34    CBC: Recent Labs  Lab 07/20/20 1544  07/21/20 0954 07/23/20 1100  WBC 5.9 11.7* 10.4  HGB 10.9* 11.2* 9.6*  HCT 36.6 38.7 33.7*  MCV 94.3 94.6 94.9  PLT 82* 187 130*    Cardiac Enzymes: No results for input(s): CKTOTAL, CKMB, CKMBINDEX, TROPONINI in the last 168 hours.  BNP (last 3 results) No results for input(s): BNP in the last 8760 hours.  ProBNP (last 3 results) No results for input(s): PROBNP in the last 8760 hours.  Radiological Exams: No results found.  Assessment/Plan Active Problems:   Acute on chronic respiratory failure with hypoxia (HCC)   Chronic atrial fibrillation (HCC)   Metabolic encephalopathy   NASH (nonalcoholic steatohepatitis)   1. Acute on chronic respiratory failure with hypoxia patient will continue on full support at this time unable to wean currently.  Continue aggressive pulmonary toilet supportive measures. 2. Chronic atrial fibrillation rate is controlled and I will continue to monitor 3. Metabolic encephalopathy no change 4. [name] continue with supportive care   I have personally seen and evaluated the patient, evaluated laboratory and imaging results, formulated the assessment and plan and placed orders. The Patient requires high complexity decision making with multiple systems involvement.  Rounds were done with the Respiratory Therapy Director and Staff therapists and discussed with nursing staff also.  Yevonne Pax, MD  [facility] Pulmonary Critical Care Medicine Sleep Medicine

## 2020-07-27 DIAGNOSIS — G9341 Metabolic encephalopathy: Secondary | ICD-10-CM | POA: Diagnosis not present

## 2020-07-27 DIAGNOSIS — I482 Chronic atrial fibrillation, unspecified: Secondary | ICD-10-CM | POA: Diagnosis not present

## 2020-07-27 DIAGNOSIS — K7581 Nonalcoholic steatohepatitis (NASH): Secondary | ICD-10-CM | POA: Diagnosis not present

## 2020-07-27 DIAGNOSIS — J9621 Acute and chronic respiratory failure with hypoxia: Secondary | ICD-10-CM | POA: Diagnosis not present

## 2020-07-27 NOTE — Progress Notes (Addendum)
Pulmonary Critical Care Medicine [facility] [place]   PULMONARY CRITICAL CARE SERVICE  PROGRESS NOTE  Date of Service: 07/27/2020  Ambrosia Wisnewski  VHQ:[id number]  DOB: June 16, 1951   DOA: 07/19/2020  Referring Physician: [name], MD  HPI: Korin Setzler is a 69 y.o. female seen for follow up of Acute on Chronic Respiratory Failure.  Patient remains on full support unable to wean at this time AC VC plus rate of 16 with an FiO2 of 28% no fever or distress.  Medications: Reviewed on Rounds  Physical Exam:  Vitals: Pulse 86 respirations 20 BP 167/83 O2 sat 100% temp 97.5  Ventilator Settings ventilator mode AC VC plus rate of 16 tidal volume 410 PEEP of 5 and FiO2 of 28%  . General: Comfortable at this time . Eyes: Grossly normal lids, irises & conjunctiva . ENT: grossly tongue is normal . Neck: no obvious mass . Cardiovascular: S1 S2 normal no gallop . Respiratory: No rales or rhonchi noted . Abdomen: soft . Skin: no rash seen on limited exam . Musculoskeletal: not rigid . Psychiatric:unable to assess . Neurologic: no seizure no involuntary movements         Lab Data:   Basic Metabolic Panel: Recent Labs  Lab 07/20/20 1349 07/21/20 0954 07/22/20 1844 07/23/20 1100 07/24/20 0904  NA 148* 150* 147* 146* 144  K 3.9 3.3* 5.2* 4.3 4.5  CL 108 106 111 107 104  CO2 30 34* 29 31 33*  GLUCOSE 323* 309* 314* 327* 260*  BUN 37* 35* 36* 34* 34*  CREATININE 0.91 1.02* 1.06* 0.96 0.87  CALCIUM 8.9 9.1 7.6* 8.4* 8.6*  MG 1.7 1.9  --   --   --     ABG: No results for input(s): PHART, PCO2ART, PO2ART, HCO3, O2SAT in the last 168 hours.  Liver Function Tests: Recent Labs  Lab 07/20/20 1349  AST 121*  ALT 98*  ALKPHOS 165*  BILITOT 1.4*  PROT 5.2*  ALBUMIN 2.6*   No results for input(s): LIPASE, AMYLASE in the last 168 hours. Recent Labs  Lab 07/20/20 1349 07/24/20 0904  AMMONIA 63* 34    CBC: Recent Labs  Lab 07/20/20 1544 07/21/20 0954  07/23/20 1100  WBC 5.9 11.7* 10.4  HGB 10.9* 11.2* 9.6*  HCT 36.6 38.7 33.7*  MCV 94.3 94.6 94.9  PLT 82* 187 130*    Cardiac Enzymes: No results for input(s): CKTOTAL, CKMB, CKMBINDEX, TROPONINI in the last 168 hours.  BNP (last 3 results) No results for input(s): BNP in the last 8760 hours.  ProBNP (last 3 results) No results for input(s): PROBNP in the last 8760 hours.  Radiological Exams: No results found.  Assessment/Plan Active Problems:   Acute on chronic respiratory failure with hypoxia (HCC)   Chronic atrial fibrillation (HCC)   Metabolic encephalopathy   NASH (nonalcoholic steatohepatitis)   1. Acute on chronic respiratory failure with hypoxia patient will continue on full support at this time unable to wean currently.  Continue aggressive pulmonary toilet supportive measures. 2. Chronic atrial fibrillation rate is controlled and I will continue to monitor 3. Metabolic encephalopathy no change 4. [name] continue with supportive care   I have personally seen and evaluated the patient, evaluated laboratory and imaging results, formulated the assessment and plan and placed orders. The Patient requires high complexity decision making with multiple systems involvement.  Rounds were done with the Respiratory Therapy Director and Staff therapists and discussed with nursing staff also.  Yevonne Pax, MD [facility] Pulmonary Critical  Care Medicine Sleep Medicine

## 2020-07-28 ENCOUNTER — Other Ambulatory Visit (HOSPITAL_COMMUNITY): Payer: Medicare Other

## 2020-07-28 DIAGNOSIS — J9621 Acute and chronic respiratory failure with hypoxia: Secondary | ICD-10-CM | POA: Diagnosis not present

## 2020-07-28 DIAGNOSIS — I482 Chronic atrial fibrillation, unspecified: Secondary | ICD-10-CM | POA: Diagnosis not present

## 2020-07-28 DIAGNOSIS — G9341 Metabolic encephalopathy: Secondary | ICD-10-CM | POA: Diagnosis not present

## 2020-07-28 DIAGNOSIS — K7581 Nonalcoholic steatohepatitis (NASH): Secondary | ICD-10-CM | POA: Diagnosis not present

## 2020-07-28 LAB — CBC
HCT: 33 % — ABNORMAL LOW (ref 36.0–46.0)
Hemoglobin: 9.5 g/dL — ABNORMAL LOW (ref 12.0–15.0)
MCH: 27.2 pg (ref 26.0–34.0)
MCHC: 28.8 g/dL — ABNORMAL LOW (ref 30.0–36.0)
MCV: 94.6 fL (ref 80.0–100.0)
Platelets: 235 10*3/uL (ref 150–400)
RBC: 3.49 MIL/uL — ABNORMAL LOW (ref 3.87–5.11)
RDW: 20 % — ABNORMAL HIGH (ref 11.5–15.5)
WBC: 12.4 10*3/uL — ABNORMAL HIGH (ref 4.0–10.5)
nRBC: 0.2 % (ref 0.0–0.2)

## 2020-07-28 LAB — BASIC METABOLIC PANEL
Anion gap: 9 (ref 5–15)
BUN: 41 mg/dL — ABNORMAL HIGH (ref 8–23)
CO2: 29 mmol/L (ref 22–32)
Calcium: 9 mg/dL (ref 8.9–10.3)
Chloride: 101 mmol/L (ref 98–111)
Creatinine, Ser: 0.84 mg/dL (ref 0.44–1.00)
GFR calc Af Amer: >60 mL/min (ref >60)
GFR calc non Af Amer: >60 mL/min (ref >60)
Glucose, Bld: 189 mg/dL — ABNORMAL HIGH (ref 70–99)
Potassium: 4.9 mmol/L (ref 3.5–5.1)
Sodium: 139 mmol/L (ref 135–145)

## 2020-07-28 LAB — HEMOGLOBIN A1C
Hgb A1c MFr Bld: 9 % — ABNORMAL HIGH (ref 4.8–5.6)
Mean Plasma Glucose: 211.6 mg/dL

## 2020-07-28 LAB — AMMONIA: Ammonia: 77 umol/L — ABNORMAL HIGH (ref 9–35)

## 2020-07-28 NOTE — Progress Notes (Signed)
Pulmonary Critical Care Medicine [facility] [place]   PULMONARY CRITICAL CARE SERVICE  PROGRESS NOTE  Date of Service: 07/28/2020  Emily Ho  GGE:[id number]  DOB: September 01, 1951   DOA: 07/19/2020  Referring Physician: [name], MD  HPI: Emily Ho is a 69 y.o. female seen for follow up of Acute on Chronic Respiratory Failure.  Patient is currently on full support on assist control mode has been on 28% FiO2 respiratory therapy will assess the RSB I again and try to wean today  Medications: Reviewed on Rounds  Physical Exam:  Vitals: Temperature is 97.6 pulse 98 respiratory 23 blood pressure is 92/59 saturations 100%  Ventilator Settings on assist control FiO2 is 28% tidal volume 410 PEEP of 10  . General: Comfortable at this time . Eyes: Grossly normal lids, irises & conjunctiva . ENT: grossly tongue is normal . Neck: no obvious mass . Cardiovascular: S1 S2 normal no gallop . Respiratory: No rhonchi with no rales . Abdomen: soft . Skin: no rash seen on limited exam . Musculoskeletal: not rigid . Psychiatric:unable to assess . Neurologic: no seizure no involuntary movements         Lab Data:   Basic Metabolic Panel: Recent Labs  Lab 07/21/20 0954 07/22/20 1844 07/23/20 1100 07/24/20 0904 07/28/20 0549  NA 150* 147* 146* 144 139  K 3.3* 5.2* 4.3 4.5 4.9  CL 106 111 107 104 101  CO2 34* 29 31 33* 29  GLUCOSE 309* 314* 327* 260* 189*  BUN 35* 36* 34* 34* 41*  CREATININE 1.02* 1.06* 0.96 0.87 0.84  CALCIUM 9.1 7.6* 8.4* 8.6* 9.0  MG 1.9  --   --   --   --     ABG: No results for input(s): PHART, PCO2ART, PO2ART, HCO3, O2SAT in the last 168 hours.  Liver Function Tests: No results for input(s): AST, ALT, ALKPHOS, BILITOT, PROT, ALBUMIN in the last 168 hours. No results for input(s): LIPASE, AMYLASE in the last 168 hours. Recent Labs  Lab 07/24/20 0904 07/28/20 0549  AMMONIA 34 77*    CBC: Recent Labs  Lab 07/21/20 0954 07/23/20 1100  07/28/20 0549  WBC 11.7* 10.4 12.4*  HGB 11.2* 9.6* 9.5*  HCT 38.7 33.7* 33.0*  MCV 94.6 94.9 94.6  PLT 187 130* 235    Cardiac Enzymes: No results for input(s): CKTOTAL, CKMB, CKMBINDEX, TROPONINI in the last 168 hours.  BNP (last 3 results) No results for input(s): BNP in the last 8760 hours.  ProBNP (last 3 results) No results for input(s): PROBNP in the last 8760 hours.  Radiological Exams: No results found.  Assessment/Plan Active Problems:   Acute on chronic respiratory failure with hypoxia (HCC)   Chronic atrial fibrillation (HCC)   Metabolic encephalopathy   NASH (nonalcoholic steatohepatitis)   1. Acute on chronic respiratory failure hypoxia we will continue with full support on assist control patient is going to have the RSB I checked and will try to wean again today 2. Chronic atrial fibrillation rate now rate controlled 3. Metabolic encephalopathy no change 4. -Emily Ho continue with supportive care   I have personally seen and evaluated the patient, evaluated laboratory and imaging results, formulated the assessment and plan and placed orders. The Patient requires high complexity decision making with multiple systems involvement.  Rounds were done with the Respiratory Therapy Director and Staff therapists and discussed with nursing staff also.  Yevonne Pax, MD [facility] Pulmonary Critical Care Medicine Sleep Medicine

## 2020-07-29 DIAGNOSIS — G9341 Metabolic encephalopathy: Secondary | ICD-10-CM | POA: Diagnosis not present

## 2020-07-29 DIAGNOSIS — J9621 Acute and chronic respiratory failure with hypoxia: Secondary | ICD-10-CM | POA: Diagnosis not present

## 2020-07-29 DIAGNOSIS — I482 Chronic atrial fibrillation, unspecified: Secondary | ICD-10-CM | POA: Diagnosis not present

## 2020-07-29 DIAGNOSIS — K7581 Nonalcoholic steatohepatitis (NASH): Secondary | ICD-10-CM | POA: Diagnosis not present

## 2020-07-29 NOTE — Progress Notes (Signed)
Pulmonary Critical Care Medicine [facility] [place]   PULMONARY CRITICAL CARE SERVICE  PROGRESS NOTE  Date of Service: 07/29/2020  Emily Ho  PYP:[id number]  DOB: 10-06-1951   DOA: 07/19/2020  Referring Physician: [name], MD  HPI: Emily Ho is a 68 y.o. female seen for follow up of Acute on Chronic Respiratory Failure.  Patient currently is on assist control mode has been on 28% FiO2 with good saturations.  Medications: Reviewed on Rounds  Physical Exam:  Vitals: Temperature is 97.9 pulse 80 respiratory blood pressure is 110/61 saturations 99%  Ventilator Settings on assist control FiO2 28% tidal volume 410 PEEP 5  . General: Comfortable at this time . Eyes: Grossly normal lids, irises & conjunctiva . ENT: grossly tongue is normal . Neck: no obvious mass . Cardiovascular: S1 S2 normal no gallop . Respiratory: No rhonchi no rales are noted at this time . Abdomen: soft . Skin: no rash seen on limited exam . Musculoskeletal: not rigid . Psychiatric:unable to assess . Neurologic: no seizure no involuntary movements         Lab Data:   Basic Metabolic Panel: Recent Labs  Lab 07/22/20 1844 07/23/20 1100 07/24/20 0904 07/28/20 0549  NA 147* 146* 144 139  K 5.2* 4.3 4.5 4.9  CL 111 107 104 101  CO2 29 31 33* 29  GLUCOSE 314* 327* 260* 189*  BUN 36* 34* 34* 41*  CREATININE 1.06* 0.96 0.87 0.84  CALCIUM 7.6* 8.4* 8.6* 9.0    ABG: No results for input(s): PHART, PCO2ART, PO2ART, HCO3, O2SAT in the last 168 hours.  Liver Function Tests: No results for input(s): AST, ALT, ALKPHOS, BILITOT, PROT, ALBUMIN in the last 168 hours. No results for input(s): LIPASE, AMYLASE in the last 168 hours. Recent Labs  Lab 07/24/20 0904 07/28/20 0549  AMMONIA 34 77*    CBC: Recent Labs  Lab 07/23/20 1100 07/28/20 0549  WBC 10.4 12.4*  HGB 9.6* 9.5*  HCT 33.7* 33.0*  MCV 94.9 94.6  PLT 130* 235    Cardiac Enzymes: No results for input(s): CKTOTAL,  CKMB, CKMBINDEX, TROPONINI in the last 168 hours.  BNP (last 3 results) No results for input(s): BNP in the last 8760 hours.  ProBNP (last 3 results) No results for input(s): PROBNP in the last 8760 hours.  Radiological Exams: DG CHEST PORT 1 VIEW  Result Date: 07/28/2020 CLINICAL DATA:  There code cytosis. EXAM: PORTABLE CHEST 1 VIEW COMPARISON:  07/19/2020 FINDINGS: Endotracheal tube tip is approximately 1.6 cm from the carina. Gastric tube courses below the diaphragm in outside the field of view. Very low lung volumes with right greater than left opacities. No discernible pneumothorax. Possible right pleural effusion. No acute osseous abnormality. Cardiac silhouette is largely obscured, but visualized portions appear similar to prior. Elevation the right hemidiaphragm. IMPRESSION: 1. Endotracheal tube tip is approximately 1.6 cm from the carina. 2. Very low lung volumes with right greater than left opacities, possibly atelectasis, aspiration, and/or pneumonia. 3. Possible right pleural effusion. Dedicated PA and lateral radiographs could further characterize if clinically indicated. Electronically Signed   By: Feliberto Harts MD   On: 07/28/2020 11:41    Assessment/Plan Active Problems:   Acute on chronic respiratory failure with hypoxia (HCC)   Chronic atrial fibrillation (HCC)   Metabolic encephalopathy   NASH (nonalcoholic steatohepatitis)   1. Acute on chronic respiratory failure with hypoxia we will continue with full support on the ventilator.  Titrate oxygen as tolerated 2. Chronic atrial fibrillation rate  is controlled 3. Metabolic encephalopathy no change 4. Nonalcoholic hepatitis supportive care   I have personally seen and evaluated the patient, evaluated laboratory and imaging results, formulated the assessment and plan and placed orders. The Patient requires high complexity decision making with multiple systems involvement.  Rounds were done with the Respiratory  Therapy Director and Staff therapists and discussed with nursing staff also.  Yevonne Pax, MD [facility] Pulmonary Critical Care Medicine Sleep Medicine

## 2020-07-30 ENCOUNTER — Other Ambulatory Visit (HOSPITAL_COMMUNITY): Payer: Medicare Other

## 2020-07-30 DIAGNOSIS — J9621 Acute and chronic respiratory failure with hypoxia: Secondary | ICD-10-CM | POA: Diagnosis not present

## 2020-07-30 DIAGNOSIS — I482 Chronic atrial fibrillation, unspecified: Secondary | ICD-10-CM | POA: Diagnosis not present

## 2020-07-30 DIAGNOSIS — K7581 Nonalcoholic steatohepatitis (NASH): Secondary | ICD-10-CM | POA: Diagnosis not present

## 2020-07-30 DIAGNOSIS — G9341 Metabolic encephalopathy: Secondary | ICD-10-CM | POA: Diagnosis not present

## 2020-07-30 LAB — MAGNESIUM: Magnesium: 2.1 mg/dL (ref 1.7–2.4)

## 2020-07-30 LAB — BASIC METABOLIC PANEL
Anion gap: 7 (ref 5–15)
BUN: 51 mg/dL — ABNORMAL HIGH (ref 8–23)
CO2: 32 mmol/L (ref 22–32)
Calcium: 8.4 mg/dL — ABNORMAL LOW (ref 8.9–10.3)
Chloride: 102 mmol/L (ref 98–111)
Creatinine, Ser: 1.02 mg/dL — ABNORMAL HIGH (ref 0.44–1.00)
GFR calc Af Amer: >60 mL/min (ref >60)
GFR calc non Af Amer: 55 mL/min — ABNORMAL LOW (ref >60)
Glucose, Bld: 204 mg/dL — ABNORMAL HIGH (ref 70–99)
Potassium: 5.4 mmol/L — ABNORMAL HIGH (ref 3.5–5.1)
Sodium: 141 mmol/L (ref 135–145)

## 2020-07-30 NOTE — Progress Notes (Signed)
Pulmonary Critical Care Medicine [facility] [place]   PULMONARY CRITICAL CARE SERVICE  PROGRESS NOTE  Date of Service: 07/30/2020  Emily Ho  [id number]  DOB: [birth year]   DOA: 07/19/2020  Referring Physician: [name], MD  HPI: Emily Ho is a 69 y.o. female seen for follow up of Acute on Chronic Respiratory Failure.  Patient currently is on full support on assist control mode has been on 28% FiO2  Medications: Reviewed on Rounds  Physical Exam:  Vitals: Temperature is 98.0 pulse 70 respiratory rate is 22 blood pressure is 167/87 saturations 98%  Ventilator Settings on assist control FiO2 28% tidal volume is 410 PEEP 5   General: Comfortable at this time  Eyes: Grossly normal lids, irises & conjunctiva  ENT: grossly tongue is normal  Neck: no obvious mass  Cardiovascular: S1 S2 normal no gallop  Respiratory: No rhonchi coarse breath sounds  Abdomen: soft  Skin: no rash seen on limited exam  Musculoskeletal: not rigid  Psychiatric:unable to assess  Neurologic: no seizure no involuntary movements         Lab Data:   Basic Metabolic Panel: Recent Labs  Lab 07/23/20 1100 07/24/20 0904 07/28/20 0549  NA 146* 144 139  K 4.3 4.5 4.9  CL 107 104 101  CO2 31 33* 29  GLUCOSE 327* 260* 189*  BUN 34* 34* 41*  CREATININE 0.96 0.87 0.84  CALCIUM 8.4* 8.6* 9.0    ABG: No results for input(s): PHART, PCO2ART, PO2ART, HCO3, O2SAT in the last 168 hours.  Liver Function Tests: No results for input(s): AST, ALT, ALKPHOS, BILITOT, PROT, ALBUMIN in the last 168 hours. No results for input(s): LIPASE, AMYLASE in the last 168 hours. Recent Labs  Lab 07/24/20 0904 07/28/20 0549  AMMONIA 34 77*    CBC: Recent Labs  Lab 07/23/20 1100 07/28/20 0549  WBC 10.4 12.4*  HGB 9.6* 9.5*  HCT 33.7* 33.0*  MCV 94.9 94.6  PLT 130* 235    Cardiac Enzymes: No results for input(s): CKTOTAL, CKMB, CKMBINDEX, TROPONINI in the last 168 hours.  BNP  (last 3 results) No results for input(s): BNP in the last 8760 hours.  ProBNP (last 3 results) No results for input(s): PROBNP in the last 8760 hours.  Radiological Exams: DG CHEST PORT 1 VIEW  Result Date: 07/28/2020 CLINICAL DATA:  There code cytosis. EXAM: PORTABLE CHEST 1 VIEW COMPARISON:  07/19/2020 FINDINGS: Endotracheal tube tip is approximately 1.6 cm from the carina. Gastric tube courses below the diaphragm in outside the field of view. Very low lung volumes with right greater than left opacities. No discernible pneumothorax. Possible right pleural effusion. No acute osseous abnormality. Cardiac silhouette is largely obscured, but visualized portions appear similar to prior. Elevation the right hemidiaphragm. IMPRESSION: 1. Endotracheal tube tip is approximately 1.6 cm from the carina. 2. Very low lung volumes with right greater than left opacities, possibly atelectasis, aspiration, and/or pneumonia. 3. Possible right pleural effusion. Dedicated PA and lateral radiographs could further characterize if clinically indicated. Electronically Signed   By: Feliberto Harts MD   On: 07/28/2020 11:41    Assessment/Plan Active Problems:   Acute on chronic respiratory failure with hypoxia (HCC)   Chronic atrial fibrillation (HCC)   Metabolic encephalopathy   NASH (nonalcoholic steatohepatitis)   1. Acute on chronic respiratory failure with hypoxia we will continue with assist control with an FiO2 28% tidal volume 410 with a PEEP of 5 check RSB I mechanics 2. Chronic atrial fibrillation rate is controlled  3. Metabolic encephalopathy no change 4. Non Alcoholic hepatitis supportive care   I have personally seen and evaluated the patient, evaluated laboratory and imaging results, formulated the assessment and plan and placed orders. The Patient requires high complexity decision making with multiple systems involvement.  Rounds were done with the Respiratory Therapy Director and Staff  therapists and discussed with nursing staff also.  Yevonne Pax, MD [facility] Pulmonary Critical Care Medicine Sleep Medicine

## 2020-07-31 ENCOUNTER — Ambulatory Visit (INDEPENDENT_AMBULATORY_CARE_PROVIDER_SITE_OTHER): Payer: Self-pay | Admitting: Otolaryngology

## 2020-07-31 DIAGNOSIS — I482 Chronic atrial fibrillation, unspecified: Secondary | ICD-10-CM | POA: Diagnosis not present

## 2020-07-31 DIAGNOSIS — J9620 Acute and chronic respiratory failure, unspecified whether with hypoxia or hypercapnia: Secondary | ICD-10-CM

## 2020-07-31 DIAGNOSIS — G9341 Metabolic encephalopathy: Secondary | ICD-10-CM | POA: Diagnosis not present

## 2020-07-31 DIAGNOSIS — K7581 Nonalcoholic steatohepatitis (NASH): Secondary | ICD-10-CM | POA: Diagnosis not present

## 2020-07-31 DIAGNOSIS — J9621 Acute and chronic respiratory failure with hypoxia: Secondary | ICD-10-CM | POA: Diagnosis not present

## 2020-07-31 LAB — POTASSIUM: Potassium: 5 mmol/L (ref 3.5–5.1)

## 2020-07-31 NOTE — Progress Notes (Addendum)
Pulmonary Critical Care Medicine [facility] [place]   PULMONARY CRITICAL CARE SERVICE  PROGRESS NOTE  Date of Service: 07/31/2020  Emily Ho  NIO:[id number]  DOB: Nov 18, 1950   DOA: 07/19/2020  Referring Physician: [name], MD  HPI: Emily Ho is a 69 y.o. female seen for follow up of Acute on Chronic Respiratory Failure.  Patient remains on pressure support at this time 12/5 and FiO2 20% for goal of 2 to 4 hours satting well no distress.  Medications: Reviewed on Rounds  Physical Exam:  Vitals: Pulse 101 respirations 17 BP 128/54 O2 sat 99% temp 97.0  Ventilator Settings pressure support 12/5 FiO2 28%  . General: Comfortable at this time . Eyes: Grossly normal lids, irises & conjunctiva . ENT: grossly tongue is normal . Neck: no obvious mass . Cardiovascular: S1 S2 normal no gallop . Respiratory: Coarse breath sounds . Abdomen: soft . Skin: no rash seen on limited exam . Musculoskeletal: not rigid . Psychiatric:unable to assess . Neurologic: no seizure no involuntary movements         Lab Data:   Basic Metabolic Panel: Recent Labs  Lab 07/28/20 0549 07/30/20 0924 07/31/20 0625  NA 139 141  --   K 4.9 5.4* 5.0  CL 101 102  --   CO2 29 32  --   GLUCOSE 189* 204*  --   BUN 41* 51*  --   CREATININE 0.84 1.02*  --   CALCIUM 9.0 8.4*  --   MG  --  2.1  --     ABG: No results for input(s): PHART, PCO2ART, PO2ART, HCO3, O2SAT in the last 168 hours.  Liver Function Tests: No results for input(s): AST, ALT, ALKPHOS, BILITOT, PROT, ALBUMIN in the last 168 hours. No results for input(s): LIPASE, AMYLASE in the last 168 hours. Recent Labs  Lab 07/28/20 0549  AMMONIA 77*    CBC: Recent Labs  Lab 07/28/20 0549  WBC 12.4*  HGB 9.5*  HCT 33.0*  MCV 94.6  PLT 235    Cardiac Enzymes: No results for input(s): CKTOTAL, CKMB, CKMBINDEX, TROPONINI in the last 168 hours.  BNP (last 3 results) No results for input(s): BNP in the last 8760  hours.  ProBNP (last 3 results) No results for input(s): PROBNP in the last 8760 hours.  Radiological Exams: DG Chest Port 1 View  Result Date: 07/30/2020 CLINICAL DATA:  Check PICC line placement EXAM: PORTABLE CHEST 1 VIEW COMPARISON:  07/28/2020 FINDINGS: Endotracheal tube and gastric catheter are again noted and stable. Left-sided PICC line is now seen with the catheter tip at the cavoatrial junction. Cardiac shadow is stable. Small right-sided pleural effusion is noted. IMPRESSION: New left-sided PICC line in satisfactory position. Right-sided pleural effusion. Electronically Signed   By: Alcide Clever M.D.   On: 07/30/2020 16:56    Assessment/Plan Active Problems:   Acute on chronic respiratory failure with hypoxia (HCC)   Chronic atrial fibrillation (HCC)   Metabolic encephalopathy   NASH (nonalcoholic steatohepatitis)   1. Acute on chronic respiratory failure with hypoxia patient will continue to wean on pressure support 12/5 FiO2 28% continue supportive measures and aggressive pulmonary toilet. 2. Chronic atrial fibrillation rate is controlled 3. Metabolic encephalopathy no change 4. Non Alcoholic hepatitis supportive care   I have personally seen and evaluated the patient, evaluated laboratory and imaging results, formulated the assessment and plan and placed orders. The Patient requires high complexity decision making with multiple systems involvement.  Rounds were done with the Respiratory  Therapy Director and Staff therapists and discussed with nursing staff also.  Allyne Gee, MD [facility] Pulmonary Critical Care Medicine Sleep Medicine

## 2020-07-31 NOTE — H&P (Signed)
PREOPERATIVE H&P  Chief Complaint: Acute on chronic respiratory failure  HPI: Emily Ho is a 69 y.o. female who presents for evaluation of acute on chronic respiratory failure referred by [facility] for consideration of tracheostomy.  Patient was admitted to outside hospital from a nursing home because of mental status changes.  She was intubated at time of admission on 07/07/2020.  She was diagnosed with metabolic encephalopathy and respiratory failure.  She had previous history of type 2 diabetes, CVA, chronic A. fib, hypertension and chronic kidney disease.  She has had remote history of colon cancer.  She was subsequently extubated about a week later but had to be reintubated on 07/17/2020.  She was subsequently transferred to [facility] on 07/19/2020 for long-term respiratory care in hopes of weaning from chronic intubation.  They have been unsuccessful to weaning her over the past 2 weeks have recommended tracheostomy as she is combative and chronically has to be sedated.  Past Medical History:  Diagnosis Date  . Acute on chronic respiratory failure with hypoxia (HCC)   . Chronic atrial fibrillation (HCC)   . Metabolic encephalopathy   . NASH (nonalcoholic steatohepatitis)     Social History   Socioeconomic History  . Marital status: Widowed    Spouse name: Not on file  . Number of children: Not on file  . Years of education: Not on file  . Highest education level: Not on file  Occupational History  . Not on file  Tobacco Use  . Smoking status: Not on file  Substance and Sexual Activity  . Alcohol use: Not on file  . Drug use: Not on file  . Sexual activity: Not on file  Other Topics Concern  . Not on file  Social History Narrative  . Not on file   Social Determinants of Health   Financial Resource Strain:   . Difficulty of Paying Living Expenses: Not on file  Food Insecurity:   . Worried About Programme researcher, broadcasting/film/video in the Last Year: Not on  file  . Ran Out of Food in the Last Year: Not on file  Transportation Needs:   . Lack of Transportation (Medical): Not on file  . Lack of Transportation (Non-Medical): Not on file  Physical Activity:   . Days of Exercise per Week: Not on file  . Minutes of Exercise per Session: Not on file  Stress:   . Feeling of Stress : Not on file  Social Connections:   . Frequency of Communication with Friends and Family: Not on file  . Frequency of Social Gatherings with Friends and Family: Not on file  . Attends Religious Services: Not on file  . Active Member of Clubs or Organizations: Not on file  . Attends Banker Meetings: Not on file  . Marital Status: Not on file   No family history on file. Not on File Prior to Admission medications   Not on File     Positive ROS: n/a  All other systems have been reviewed and were otherwise negative with the exception of those mentioned in the HPI and as above.  Physical Exam: There were no vitals filed for this visit.  General: Patient is intubated and not responsive.   Nasal: Clear nasal passages Neck: No palpable adenopathy or thyroid nodules.  Patient can extend the neck and trachea is palpably midline with no palpable masses. Cardiovascular: IRR  Respiratory: Clear to auscultation   Assessment/Plan: Chronic respiratory failure with chronic intubation over  the past 2 weeks.  Plan for tracheostomy  Dillard Cannon, MD 07/31/2020 1:03 PM

## 2020-07-31 NOTE — H&P (View-Only) (Signed)
PREOPERATIVE H&P  Chief Complaint: Acute on chronic respiratory failure  HPI: Emily Ho is a 69 y.o. female who presents for evaluation of acute on chronic respiratory failure referred by [facility] for consideration of tracheostomy.  Patient was admitted to outside hospital from a nursing home because of mental status changes.  She was intubated at time of admission on 07/07/2020.  She was diagnosed with metabolic encephalopathy and respiratory failure.  She had previous history of type 2 diabetes, CVA, chronic A. fib, hypertension and chronic kidney disease.  She has had remote history of colon cancer.  She was subsequently extubated about a week later but had to be reintubated on 07/17/2020.  She was subsequently transferred to [facility] on 07/19/2020 for long-term respiratory care in hopes of weaning from chronic intubation.  They have been unsuccessful to weaning her over the past 2 weeks have recommended tracheostomy as she is combative and chronically has to be sedated.  Past Medical History:  Diagnosis Date  . Acute on chronic respiratory failure with hypoxia (HCC)   . Chronic atrial fibrillation (HCC)   . Metabolic encephalopathy   . NASH (nonalcoholic steatohepatitis)     Social History   Socioeconomic History  . Marital status: Widowed    Spouse name: Not on file  . Number of children: Not on file  . Years of education: Not on file  . Highest education level: Not on file  Occupational History  . Not on file  Tobacco Use  . Smoking status: Not on file  Substance and Sexual Activity  . Alcohol use: Not on file  . Drug use: Not on file  . Sexual activity: Not on file  Other Topics Concern  . Not on file  Social History Narrative  . Not on file   Social Determinants of Health   Financial Resource Strain:   . Difficulty of Paying Living Expenses: Not on file  Food Insecurity:   . Worried About Programme researcher, broadcasting/film/video in the Last Year: Not on  file  . Ran Out of Food in the Last Year: Not on file  Transportation Needs:   . Lack of Transportation (Medical): Not on file  . Lack of Transportation (Non-Medical): Not on file  Physical Activity:   . Days of Exercise per Week: Not on file  . Minutes of Exercise per Session: Not on file  Stress:   . Feeling of Stress : Not on file  Social Connections:   . Frequency of Communication with Friends and Family: Not on file  . Frequency of Social Gatherings with Friends and Family: Not on file  . Attends Religious Services: Not on file  . Active Member of Clubs or Organizations: Not on file  . Attends Banker Meetings: Not on file  . Marital Status: Not on file   No family history on file. Not on File Prior to Admission medications   Not on File     Positive ROS: n/a  All other systems have been reviewed and were otherwise negative with the exception of those mentioned in the HPI and as above.  Physical Exam: There were no vitals filed for this visit.  General: Patient is intubated and not responsive.   Nasal: Clear nasal passages Neck: No palpable adenopathy or thyroid nodules.  Patient can extend the neck and trachea is palpably midline with no palpable masses. Cardiovascular: IRR  Respiratory: Clear to auscultation   Assessment/Plan: Chronic respiratory failure with chronic intubation over  the past 2 weeks.  Plan for tracheostomy  Dillard Cannon, MD 07/31/2020 1:03 PM

## 2020-08-01 DIAGNOSIS — G9341 Metabolic encephalopathy: Secondary | ICD-10-CM | POA: Diagnosis not present

## 2020-08-01 DIAGNOSIS — K7581 Nonalcoholic steatohepatitis (NASH): Secondary | ICD-10-CM | POA: Diagnosis not present

## 2020-08-01 DIAGNOSIS — J9621 Acute and chronic respiratory failure with hypoxia: Secondary | ICD-10-CM | POA: Diagnosis not present

## 2020-08-01 DIAGNOSIS — I482 Chronic atrial fibrillation, unspecified: Secondary | ICD-10-CM | POA: Diagnosis not present

## 2020-08-01 LAB — CBC
HCT: 34 % — ABNORMAL LOW (ref 36.0–46.0)
Hemoglobin: 10 g/dL — ABNORMAL LOW (ref 12.0–15.0)
MCH: 27 pg (ref 26.0–34.0)
MCHC: 29.4 g/dL — ABNORMAL LOW (ref 30.0–36.0)
MCV: 91.9 fL (ref 80.0–100.0)
Platelets: 263 10*3/uL (ref 150–400)
RBC: 3.7 MIL/uL — ABNORMAL LOW (ref 3.87–5.11)
RDW: 20 % — ABNORMAL HIGH (ref 11.5–15.5)
WBC: 13.1 10*3/uL — ABNORMAL HIGH (ref 4.0–10.5)
nRBC: 0.2 % (ref 0.0–0.2)

## 2020-08-01 LAB — BASIC METABOLIC PANEL
Anion gap: 9 (ref 5–15)
BUN: 47 mg/dL — ABNORMAL HIGH (ref 8–23)
CO2: 30 mmol/L (ref 22–32)
Calcium: 8.7 mg/dL — ABNORMAL LOW (ref 8.9–10.3)
Chloride: 97 mmol/L — ABNORMAL LOW (ref 98–111)
Creatinine, Ser: 0.81 mg/dL (ref 0.44–1.00)
GFR calc non Af Amer: >60 mL/min (ref >60)
Glucose, Bld: 116 mg/dL — ABNORMAL HIGH (ref 70–99)
Potassium: 4.5 mmol/L (ref 3.5–5.1)
Sodium: 136 mmol/L (ref 135–145)

## 2020-08-01 LAB — URIC ACID: Uric Acid, Serum: 4.7 mg/dL (ref 2.5–7.1)

## 2020-08-01 LAB — AMMONIA: Ammonia: 86 umol/L — ABNORMAL HIGH (ref 9–35)

## 2020-08-01 NOTE — Progress Notes (Addendum)
Pulmonary Critical Care Medicine [facility] [place]   PULMONARY CRITICAL CARE SERVICE  PROGRESS NOTE  Date of Service: 08/01/2020  Emily Ho  ACZ:[id number]  DOB: [birth year]   DOA: 07/19/2020  Referring Physician: [name], MD  HPI: Emily Ho is a 69 y.o. female seen for follow up of Acute on Chronic Respiratory Failure.  Patient remains on pressure support at this time for 8-hour goal today.  Satting well no distress.  Medications: Reviewed on Rounds  Physical Exam:  Vitals: Pulse 76 respirations 26 BP 137/67 O2 sat 97% temp 98.1  Ventilator Settings per support 12/5 FiO2 28%  . General: Comfortable at this time . Eyes: Grossly normal lids, irises & conjunctiva . ENT: grossly tongue is normal . Neck: no obvious mass . Cardiovascular: S1 S2 normal no gallop . Respiratory: Coarse breath sounds . Abdomen: soft . Skin: no rash seen on limited exam . Musculoskeletal: not rigid . Psychiatric:unable to assess . Neurologic: no seizure no involuntary movements         Lab Data:   Basic Metabolic Panel: Recent Labs  Lab 07/28/20 0549 07/30/20 0924 07/31/20 0625  NA 139 141  --   K 4.9 5.4* 5.0  CL 101 102  --   CO2 29 32  --   GLUCOSE 189* 204*  --   BUN 41* 51*  --   CREATININE 0.84 1.02*  --   CALCIUM 9.0 8.4*  --   MG  --  2.1  --     ABG: No results for input(s): PHART, PCO2ART, PO2ART, HCO3, O2SAT in the last 168 hours.  Liver Function Tests: No results for input(s): AST, ALT, ALKPHOS, BILITOT, PROT, ALBUMIN in the last 168 hours. No results for input(s): LIPASE, AMYLASE in the last 168 hours. Recent Labs  Lab 07/28/20 0549  AMMONIA 77*    CBC: Recent Labs  Lab 07/28/20 0549  WBC 12.4*  HGB 9.5*  HCT 33.0*  MCV 94.6  PLT 235    Cardiac Enzymes: No results for input(s): CKTOTAL, CKMB, CKMBINDEX, TROPONINI in the last 168 hours.  BNP (last 3 results) No results for input(s): BNP in the last 8760 hours.  ProBNP (last 3  results) No results for input(s): PROBNP in the last 8760 hours.  Radiological Exams: DG Chest Port 1 View  Result Date: 07/30/2020 CLINICAL DATA:  Check PICC line placement EXAM: PORTABLE CHEST 1 VIEW COMPARISON:  07/28/2020 FINDINGS: Endotracheal tube and gastric catheter are again noted and stable. Left-sided PICC line is now seen with the catheter tip at the cavoatrial junction. Cardiac shadow is stable. Small right-sided pleural effusion is noted. IMPRESSION: New left-sided PICC line in satisfactory position. Right-sided pleural effusion. Electronically Signed   By: Alcide Clever M.D.   On: 07/30/2020 16:56    Assessment/Plan Active Problems:   Acute on chronic respiratory failure with hypoxia (HCC)   Chronic atrial fibrillation (HCC)   Metabolic encephalopathy   NASH (nonalcoholic steatohepatitis)   1. Acute on chronic respiratory failure with hypoxia patient will continue to wean on pressure support 12/5 FiO2 28% continue supportive measures and aggressive pulmonary toilet. 2. Chronic atrial fibrillation rate is controlled 3. Metabolic encephalopathy no change 4. NonAlcoholic hepatitis supportive care   I have personally seen and evaluated the patient, evaluated laboratory and imaging results, formulated the assessment and plan and placed orders. The Patient requires high complexity decision making with multiple systems involvement.  Rounds were done with the Respiratory Therapy Director and Staff therapists and discussed  with nursing staff also.  Allyne Gee, MD [facility] Pulmonary Critical Care Medicine Sleep Medicine

## 2020-08-02 DIAGNOSIS — G9341 Metabolic encephalopathy: Secondary | ICD-10-CM | POA: Diagnosis not present

## 2020-08-02 DIAGNOSIS — I482 Chronic atrial fibrillation, unspecified: Secondary | ICD-10-CM | POA: Diagnosis not present

## 2020-08-02 DIAGNOSIS — J9621 Acute and chronic respiratory failure with hypoxia: Secondary | ICD-10-CM | POA: Diagnosis not present

## 2020-08-02 DIAGNOSIS — K7581 Nonalcoholic steatohepatitis (NASH): Secondary | ICD-10-CM | POA: Diagnosis not present

## 2020-08-02 LAB — SARS CORONAVIRUS 2 BY RT PCR (HOSPITAL ORDER, PERFORMED IN ~~LOC~~ HOSPITAL LAB): SARS Coronavirus 2: NEGATIVE

## 2020-08-02 NOTE — Anesthesia Preprocedure Evaluation (Addendum)
Anesthesia Evaluation  Patient identified by MRN, date of birth, ID band Patient unresponsive    Reviewed: Allergy & Precautions, H&P , NPO status , Patient's Chart, lab work & pertinent test results  Airway Mallampati: Intubated  TM Distance: >3 FB Neck ROM: Full    Dental no notable dental hx.    Pulmonary neg pulmonary ROS,  Acute on chronic respiratory failure with hypoxia   Pulmonary exam normal breath sounds clear to auscultation       Cardiovascular Exercise Tolerance: Good negative cardio ROS Normal cardiovascular exam+ dysrhythmias Atrial Fibrillation  Rhythm:Regular Rate:Normal     Neuro/Psych Metabolic encephalopathy  Neuromuscular disease negative neurological ROS  negative psych ROS   GI/Hepatic negative GI ROS, Neg liver ROS, (+) Hepatitis -, Unspecified  Endo/Other  negative endocrine ROSdiabetes, Type 2  Renal/GU negative Renal ROS  negative genitourinary   Musculoskeletal negative musculoskeletal ROS (+)   Abdominal   Peds negative pediatric ROS (+)  Hematology negative hematology ROS (+) Blood dyscrasia, anemia ,   Anesthesia Other Findings history of type 2 diabetes, CVA, chronic A. fib, hypertension and chronic kidney disease.  She has had remote history of colon cancer  Reproductive/Obstetrics negative OB ROS                            Anesthesia Physical Anesthesia Plan  ASA: IV  Anesthesia Plan: General   Post-op Pain Management:    Induction: Intravenous  PONV Risk Score and Plan: 3  Airway Management Planned: Tracheostomy  Additional Equipment: None  Intra-op Plan:   Post-operative Plan: Post-operative intubation/ventilation  Informed Consent: I have reviewed the patients History and Physical, chart, labs and discussed the procedure including the risks, benefits and alternatives for the proposed anesthesia with the patient or authorized  representative who has indicated his/her understanding and acceptance.       Plan Discussed with: Anesthesiologist and CRNA  Anesthesia Plan Comments:         Anesthesia Quick Evaluation

## 2020-08-02 NOTE — Progress Notes (Signed)
Pulmonary Critical Care Medicine [facility] [place]   PULMONARY CRITICAL CARE SERVICE  PROGRESS NOTE  Date of Service: 08/02/2020  Emily Ho  VOZ:[id number]  DOB: [birth year]   DOA: 07/19/2020  Referring Physician: [name], MD  HPI: Emily Ho is a 69 y.o. female seen for follow up of Acute on Chronic Respiratory Failure.  Patient currently is on resting mode on assist control mode supposed wean for pressure support today's goal is 12 hours  Medications: Reviewed on Rounds  Physical Exam:  Vitals: Temperature is 99.8 pulse 77 respiratory rate 23 blood pressure is 90/67 saturations 96%  Ventilator Settings on assist control FiO2 is 20% tidal volume 410 PEEP 5  . General: Comfortable at this time . Eyes: Grossly normal lids, irises & conjunctiva . ENT: grossly tongue is normal . Neck: no obvious mass . Cardiovascular: S1 S2 normal no gallop . Respiratory: No rhonchi very coarse breath sounds . Abdomen: soft . Skin: no rash seen on limited exam . Musculoskeletal: not rigid . Psychiatric:unable to assess . Neurologic: no seizure no involuntary movements         Lab Data:   Basic Metabolic Panel: Recent Labs  Lab 07/28/20 0549 07/30/20 0924 07/31/20 0625 08/01/20 1021  NA 139 141  --  136  K 4.9 5.4* 5.0 4.5  CL 101 102  --  97*  CO2 29 32  --  30  GLUCOSE 189* 204*  --  116*  BUN 41* 51*  --  47*  CREATININE 0.84 1.02*  --  0.81  CALCIUM 9.0 8.4*  --  8.7*  MG  --  2.1  --   --     ABG: No results for input(s): PHART, PCO2ART, PO2ART, HCO3, O2SAT in the last 168 hours.  Liver Function Tests: No results for input(s): AST, ALT, ALKPHOS, BILITOT, PROT, ALBUMIN in the last 168 hours. No results for input(s): LIPASE, AMYLASE in the last 168 hours. Recent Labs  Lab 07/28/20 0549 08/01/20 1021  AMMONIA 77* 86*    CBC: Recent Labs  Lab 07/28/20 0549 08/01/20 1021  WBC 12.4* 13.1*  HGB 9.5* 10.0*  HCT 33.0* 34.0*  MCV 94.6 91.9   PLT 235 263    Cardiac Enzymes: No results for input(s): CKTOTAL, CKMB, CKMBINDEX, TROPONINI in the last 168 hours.  BNP (last 3 results) No results for input(s): BNP in the last 8760 hours.  ProBNP (last 3 results) No results for input(s): PROBNP in the last 8760 hours.  Radiological Exams: No results found.  Assessment/Plan Active Problems:   Acute on chronic respiratory failure with hypoxia (HCC)   Chronic atrial fibrillation (HCC)   Metabolic encephalopathy   NASH (nonalcoholic steatohepatitis)   1. Acute on chronic respiratory failure hypoxia has been weaning on pressure support scheduled for tracheostomy tomorrow I think once a tracheostomy is done we should be able to move aggressively on weaning 2. Chronic atrial fibrillation rate is controlled we will continue to monitor 3. Metabolic encephalopathy no change 4. Nonalcoholic steatohepatitis we will continue with supportive care   I have personally seen and evaluated the patient, evaluated laboratory and imaging results, formulated the assessment and plan and placed orders. The Patient requires high complexity decision making with multiple systems involvement.  Rounds were done with the Respiratory Therapy Director and Staff therapists and discussed with nursing staff also.  Yevonne Pax, MD [facility] Pulmonary Critical Care Medicine Sleep Medicine

## 2020-08-03 ENCOUNTER — Inpatient Hospital Stay (HOSPITAL_COMMUNITY)
Admission: RE | Admit: 2020-08-03 | Discharge: 2020-08-27 | Disposition: E | Payer: Medicare Other | Attending: Otolaryngology | Admitting: Otolaryngology

## 2020-08-03 ENCOUNTER — Inpatient Hospital Stay: Admission: AD | Admit: 2020-08-03 | Payer: Medicare Other | Admitting: Internal Medicine

## 2020-08-03 ENCOUNTER — Encounter (HOSPITAL_COMMUNITY): Payer: Medicare Other | Admitting: Certified Registered Nurse Anesthetist

## 2020-08-03 ENCOUNTER — Encounter (HOSPITAL_COMMUNITY)
Admission: RE | Disposition: A | Discharge status: Nursing Home (Includes ICF) | Payer: Self-pay | Attending: Internal Medicine

## 2020-08-03 DIAGNOSIS — Z9889 Other specified postprocedural states: Secondary | ICD-10-CM

## 2020-08-03 DIAGNOSIS — I482 Chronic atrial fibrillation, unspecified: Secondary | ICD-10-CM | POA: Diagnosis present

## 2020-08-03 DIAGNOSIS — Z931 Gastrostomy status: Principal | ICD-10-CM

## 2020-08-03 DIAGNOSIS — Z0189 Encounter for other specified special examinations: Secondary | ICD-10-CM

## 2020-08-03 DIAGNOSIS — J9690 Respiratory failure, unspecified, unspecified whether with hypoxia or hypercapnia: Secondary | ICD-10-CM

## 2020-08-03 DIAGNOSIS — R509 Fever, unspecified: Secondary | ICD-10-CM

## 2020-08-03 DIAGNOSIS — J9620 Acute and chronic respiratory failure, unspecified whether with hypoxia or hypercapnia: Secondary | ICD-10-CM | POA: Diagnosis not present

## 2020-08-03 DIAGNOSIS — K7581 Nonalcoholic steatohepatitis (NASH): Secondary | ICD-10-CM | POA: Diagnosis present

## 2020-08-03 DIAGNOSIS — R0603 Acute respiratory distress: Secondary | ICD-10-CM

## 2020-08-03 DIAGNOSIS — G9341 Metabolic encephalopathy: Secondary | ICD-10-CM | POA: Diagnosis present

## 2020-08-03 DIAGNOSIS — J90 Pleural effusion, not elsewhere classified: Secondary | ICD-10-CM

## 2020-08-03 DIAGNOSIS — J9621 Acute and chronic respiratory failure with hypoxia: Secondary | ICD-10-CM | POA: Diagnosis present

## 2020-08-03 HISTORY — PX: TRACHEOSTOMY TUBE PLACEMENT: SHX814

## 2020-08-03 SURGERY — CREATION, TRACHEOSTOMY
Anesthesia: General | Site: Neck

## 2020-08-03 SURGERY — CREATION, TRACHEOSTOMY
Anesthesia: General

## 2020-08-03 MED ORDER — PROPOFOL 10 MG/ML IV BOLUS
INTRAVENOUS | Status: AC
  Filled 2020-08-03: qty 20

## 2020-08-03 MED ORDER — ROCURONIUM BROMIDE 10 MG/ML (PF) SYRINGE
PREFILLED_SYRINGE | INTRAVENOUS | Status: DC | PRN
  Administered 2020-08-03: 50 mg via INTRAVENOUS

## 2020-08-03 MED ORDER — ONDANSETRON HCL 4 MG/2ML IJ SOLN
INTRAMUSCULAR | Status: DC | PRN
  Administered 2020-08-03: 4 mg via INTRAVENOUS

## 2020-08-03 MED ORDER — EPHEDRINE SULFATE-NACL 50-0.9 MG/10ML-% IV SOSY
PREFILLED_SYRINGE | INTRAVENOUS | Status: DC | PRN
  Administered 2020-08-03 (×2): 10 mg via INTRAVENOUS
  Administered 2020-08-03: 15 mg via INTRAVENOUS
  Administered 2020-08-03 (×2): 10 mg via INTRAVENOUS

## 2020-08-03 MED ORDER — MIDAZOLAM HCL 2 MG/2ML IJ SOLN
INTRAMUSCULAR | Status: AC
  Filled 2020-08-03: qty 2

## 2020-08-03 MED ORDER — LIDOCAINE-EPINEPHRINE 1 %-1:100000 IJ SOLN
INTRAMUSCULAR | Status: DC | PRN
  Administered 2020-08-03: 5 mL

## 2020-08-03 MED ORDER — CEFAZOLIN SODIUM 1 G IJ SOLR
INTRAMUSCULAR | Status: AC
  Filled 2020-08-03: qty 30

## 2020-08-03 MED ORDER — 0.9 % SODIUM CHLORIDE (POUR BTL) OPTIME
TOPICAL | Status: DC | PRN
  Administered 2020-08-03: 1000 mL

## 2020-08-03 MED ORDER — LIDOCAINE-EPINEPHRINE 1 %-1:100000 IJ SOLN
INTRAMUSCULAR | Status: AC
  Filled 2020-08-03: qty 1

## 2020-08-03 MED ORDER — FENTANYL CITRATE (PF) 250 MCG/5ML IJ SOLN
INTRAMUSCULAR | Status: AC
  Filled 2020-08-03: qty 5

## 2020-08-03 MED ORDER — PHENYLEPHRINE 40 MCG/ML (10ML) SYRINGE FOR IV PUSH (FOR BLOOD PRESSURE SUPPORT)
PREFILLED_SYRINGE | INTRAVENOUS | Status: AC
  Filled 2020-08-03: qty 20

## 2020-08-03 MED ORDER — DEXTROSE 5 % IV SOLN
INTRAVENOUS | Status: DC | PRN
  Administered 2020-08-03: 3 g via INTRAVENOUS

## 2020-08-03 MED ORDER — PHENYLEPHRINE 40 MCG/ML (10ML) SYRINGE FOR IV PUSH (FOR BLOOD PRESSURE SUPPORT)
PREFILLED_SYRINGE | INTRAVENOUS | Status: DC | PRN
  Administered 2020-08-03 (×2): 80 ug via INTRAVENOUS
  Administered 2020-08-03: 120 ug via INTRAVENOUS
  Administered 2020-08-03: 80 ug via INTRAVENOUS
  Administered 2020-08-03 (×2): 120 ug via INTRAVENOUS
  Administered 2020-08-03: 80 ug via INTRAVENOUS

## 2020-08-03 MED ORDER — FENTANYL CITRATE (PF) 250 MCG/5ML IJ SOLN
INTRAMUSCULAR | Status: DC | PRN
Start: 2020-08-03 — End: 2020-08-03
  Administered 2020-08-03 (×2): 50 ug via INTRAVENOUS

## 2020-08-03 MED ORDER — LACTATED RINGERS IV SOLN: INTRAVENOUS | Status: DC | PRN

## 2020-08-03 MED ORDER — ALBUMIN HUMAN 5 % IV SOLN: INTRAVENOUS | Status: DC | PRN

## 2020-08-03 MED ORDER — PROPOFOL 10 MG/ML IV BOLUS
INTRAVENOUS | Status: DC | PRN
  Administered 2020-08-03: 50 mg via INTRAVENOUS
  Administered 2020-08-03: 20 mg via INTRAVENOUS

## 2020-08-03 SURGICAL SUPPLY — 44 items
ATTRACTOMAT 16X20 MAGNETIC DRP (DRAPES) IMPLANT
BLADE SURG 15 STRL LF DISP TIS (BLADE) ×1 IMPLANT
BLADE SURG 15 STRL SS (BLADE) ×2
CLEANER TIP ELECTROSURG 2X2 (MISCELLANEOUS) ×3 IMPLANT
COVER SURGICAL LIGHT HANDLE (MISCELLANEOUS) ×3 IMPLANT
COVER WAND RF STERILE (DRAPES) IMPLANT
DRAPE HALF SHEET 40X57 (DRAPES) IMPLANT
DRESSING ALLEVYN LITE 5X5 NADH (GAUZE/BANDAGES/DRESSINGS) ×1 IMPLANT
DRSG ALLEVYN LITE 5X5 NADH (GAUZE/BANDAGES/DRESSINGS) ×3
ELECT COATED BLADE 2.86 ST (ELECTRODE) ×3 IMPLANT
ELECT REM PT RETURN 9FT ADLT (ELECTROSURGICAL) ×3
ELECTRODE REM PT RTRN 9FT ADLT (ELECTROSURGICAL) ×1 IMPLANT
GAUZE 4X4 16PLY RFD (DISPOSABLE) ×3 IMPLANT
GEL ULTRASOUND 20GR AQUASONIC (MISCELLANEOUS) ×3 IMPLANT
GLOVE SS BIOGEL STRL SZ 7.5 (GLOVE) ×1 IMPLANT
GLOVE SUPERSENSE BIOGEL SZ 7.5 (GLOVE) ×2
GOWN STRL REUS W/ TWL LRG LVL3 (GOWN DISPOSABLE) ×1 IMPLANT
GOWN STRL REUS W/ TWL XL LVL3 (GOWN DISPOSABLE) ×1 IMPLANT
GOWN STRL REUS W/TWL LRG LVL3 (GOWN DISPOSABLE) ×2
GOWN STRL REUS W/TWL XL LVL3 (GOWN DISPOSABLE) ×2
HOLDER TRACH TUBE VELCRO 19.5 (MISCELLANEOUS) ×3 IMPLANT
KIT BASIN OR (CUSTOM PROCEDURE TRAY) ×3 IMPLANT
KIT SUCTION CATH 14FR (SUCTIONS) ×3 IMPLANT
KIT TURNOVER KIT B (KITS) ×3 IMPLANT
NEEDLE HYPO 25GX1X1/2 BEV (NEEDLE) ×3 IMPLANT
NS IRRIG 1000ML POUR BTL (IV SOLUTION) ×3 IMPLANT
PACK EENT II TURBAN DRAPE (CUSTOM PROCEDURE TRAY) ×3 IMPLANT
PAD ARMBOARD 7.5X6 YLW CONV (MISCELLANEOUS) IMPLANT
PENCIL BUTTON HOLSTER BLD 10FT (ELECTRODE) ×3 IMPLANT
PENCIL SMOKE EVAC W/HOLSTER (ELECTROSURGICAL) ×3 IMPLANT
SPONGE DRAIN TRACH 4X4 STRL 2S (GAUZE/BANDAGES/DRESSINGS) ×3 IMPLANT
SPONGE INTESTINAL PEANUT (DISPOSABLE) ×3 IMPLANT
SURGILUBE 2OZ TUBE FLIPTOP (MISCELLANEOUS) ×3 IMPLANT
SUT SILK 2 0 SH CR/8 (SUTURE) ×3 IMPLANT
SUT SILK 3 0 TIES 10X30 (SUTURE) IMPLANT
SYR 5ML LUER SLIP (SYRINGE) ×3 IMPLANT
SYR CONTROL 10ML LL (SYRINGE) ×3 IMPLANT
TOWEL GREEN STERILE (TOWEL DISPOSABLE) ×3 IMPLANT
TOWEL GREEN STERILE FF (TOWEL DISPOSABLE) ×3 IMPLANT
TUBE CONNECTING 12'X1/4 (SUCTIONS) ×1
TUBE CONNECTING 12X1/4 (SUCTIONS) ×2 IMPLANT
TUBE TRACH  6.0 CUFF FLEX (MISCELLANEOUS) ×2
TUBE TRACH 6.0 CUFF FLEX (MISCELLANEOUS) ×1 IMPLANT
TUBE TRACH SHILEY 8 DIST CUF (TUBING) IMPLANT

## 2020-08-03 NOTE — Anesthesia Procedure Notes (Signed)
Procedure Name: Intubation Date/Time: 08/10/2020 8:23 AM Performed by: Lelon Perla, CRNA Pre-anesthesia Checklist: Patient identified, Emergency Drugs available, Suction available, Patient being monitored and Timeout performed Patient Re-evaluated:Patient Re-evaluated prior to induction Oxygen Delivery Method: Circle system utilized Preoxygenation: Pre-oxygenation with 100% oxygen Induction Type: Inhalational induction and IV induction Airway Equipment and Method: Tracheostomy Placement Confirmation: positive ETCO2 and breath sounds checked- equal and bilateral Comments: Pre-existing ETT in place, Dr. Ezzard Standing placed a Shiley #6 cuffed with no complications.  ETT removed, +ETCO2, adequate Vt and SpO2 via trach. Obturator at bedside.  No complications noted.

## 2020-08-03 NOTE — Transfer of Care (Signed)
Immediate Anesthesia Transfer of Care Note  Patient: Emily Ho  Procedure(s) Performed: TRACHEOSTOMY (N/A Neck)  Patient Location: PACU and Nursing Unit  Anesthesia Type:General  Level of Consciousness: sedated  Airway & Oxygen Therapy: Patient placed on Ventilator (see vital sign flow sheet for setting)  Post-op Assessment: Report given to RN and Post -op Vital signs reviewed and stable  Post vital signs: Reviewed and stable  Last Vitals:  Vitals Value Taken Time  BP    Temp    Pulse    Resp    SpO2      Last Pain: There were no vitals filed for this visit.       Complications: No complications documented.

## 2020-08-03 NOTE — Interval H&P Note (Signed)
History and Physical Interval Note:  08/23/2020 7:28 AM  Emily Ho  has presented today for surgery, with the diagnosis of respiratory failure.  The various methods of treatment have been discussed with the patient and family. After consideration of risks, benefits and other options for treatment, the patient has consented to  Procedure(s): TRACHEOSTOMY (N/A) as a surgical intervention.  The patient's history has been reviewed, patient examined, no change in status, stable for surgery.  I have reviewed the patient's chart and labs.  Questions were answered to the patient's satisfaction.     Dillard Cannon

## 2020-08-03 NOTE — Brief Op Note (Signed)
08/11/2020  8:37 AM  PATIENT:  Emily Ho  69 y.o. female  PRE-OPERATIVE DIAGNOSIS:  respiratory failure  POST-OPERATIVE DIAGNOSIS:  respiratory failure  PROCEDURE:  Procedure(s): TRACHEOSTOMY (N/A) # 6 Shiley  SURGEON:  Surgeon(s) and Role:    * Drema Halon, MD - Primary  PHYSICIAN ASSISTANT:   ASSISTANTS: none   ANESTHESIA:   general  EBL:  minimal   BLOOD ADMINISTERED:none  DRAINS: none   LOCAL MEDICATIONS USED:  XYLOCAINE   SPECIMEN:  No Specimen  DISPOSITION OF SPECIMEN:  N/A  COUNTS:  YES  TOURNIQUET:  * No tourniquets in log *  DICTATION: .Other Dictation: Dictation Number [id number]  PLAN OF CARE: Discharge to home after PACU  PATIENT DISPOSITION:  PACU - hemodynamically stable.   Delay start of Pharmacological VTE agent (>24hrs) due to surgical blood loss or risk of bleeding: yes

## 2020-08-03 NOTE — Anesthesia Postprocedure Evaluation (Signed)
Anesthesia Post Note  Patient: Emily Ho  Procedure(s) Performed: TRACHEOSTOMY (N/A Neck)     Patient location during evaluation: PACU Anesthesia Type: General Level of consciousness: awake and alert Pain management: pain level controlled Vital Signs Assessment: post-procedure vital signs reviewed and stable Respiratory status: spontaneous breathing, nonlabored ventilation, respiratory function stable and patient connected to nasal cannula oxygen Cardiovascular status: stable and blood pressure returned to baseline Postop Assessment: no apparent nausea or vomiting Anesthetic complications: no   No complications documented.  Last Vitals: There were no vitals filed for this visit.  Last Pain: There were no vitals filed for this visit.               Shyhiem Beeney

## 2020-08-03 NOTE — Op Note (Signed)
NAME: Emily, Ho MEDICAL RECORD WP:[id number] ACCOUNT [id number] DATE OF BIRTH:[birth year] FACILITY: [facility] LOCATION: [facility] PHYSICIAN:Zamya Culhane Braxton Feathers, MD  OPERATIVE REPORT  DATE OF PROCEDURE:  08/15/2020  PREOPERATIVE DIAGNOSIS:  Acute on chronic respiratory failure.  POSTOPERATIVE DIAGNOSIS:  Acute on chronic respiratory failure.  OPERATION PERFORMED:  Tracheostomy with a #6 Shiley tracheostomy tube.  SURGEON:  Dillard Cannon, MD  ANESTHESIA:  General endotracheal.  ESTIMATED BLOOD LOSS:  Minimal.  COMPLICATIONS:  None.  BRIEF CLINICAL NOTE:  The patient is a 69 year old female who has been intubated now for over 2 weeks.  She has been at [facility] trying to wean off ventilator.  Because of inability to wean the patient off the ventilator tracheostomy was  recommended.  DESCRIPTION OF PROCEDURE:  The patient was brought straight down from [facility] to the operating room.  She remained in her bed.  A roll was placed underneath the shoulders to extend her neck.  Neck was then prepped with Betadine solution  and draped in sterile towels and a vertical incision was made just below the cricoid cartilage in midline neck just above the suprasternal notch.  Incision was made in the midline and dissection was carried initially down with a cautery to cauterize fat  as the patient was very obese.  The strap muscles were identified and divided in the midline and retracted laterally.  The thyroid isthmus, which was generous sized was divided in the midline just below the cricoid cartilage.  First 3 tracheal rings  were exposed.  Hemostasis was obtained with cautery.  The patient had minimal bleeding throughout the case.  A horizontal tracheotomy was made between the 1st and 2nd tracheal rings just below the cricoid cartilage.  The endotracheal tube was removed and  a #6 Shiley tube was inserted without difficulty.  The patient was ventilated well.   The tracheostomy was secured to the neck with 2-0 silk sutures x4 and a Velcro trach collar around the neck.  The patient was subsequently transferred back to [facility] postoperatively.  CN/NUANCE  D:08/04/2020 T:08/07/2020 JOB:[id number]

## 2020-08-04 DIAGNOSIS — K7581 Nonalcoholic steatohepatitis (NASH): Secondary | ICD-10-CM

## 2020-08-04 DIAGNOSIS — J9621 Acute and chronic respiratory failure with hypoxia: Secondary | ICD-10-CM

## 2020-08-04 DIAGNOSIS — I482 Chronic atrial fibrillation, unspecified: Secondary | ICD-10-CM | POA: Diagnosis not present

## 2020-08-04 DIAGNOSIS — G9341 Metabolic encephalopathy: Secondary | ICD-10-CM

## 2020-08-04 NOTE — Progress Notes (Signed)
Pulmonary Critical Care Medicine [facility] [place]   PULMONARY CRITICAL CARE SERVICE  PROGRESS NOTE  Date of Service: 08/04/2020  Emily Ho  [id number]:[id number]  DOB: 02-24-1951   DOA: 2020/09/02  Referring Physician: [name], MD  HPI: Emily Ho is a 69 y.o. female seen for follow up of Acute on Chronic Respiratory Failure.  Patient is on the ventilator on assist control mode had a tracheostomy done without any major issues.  Medications: Reviewed on Rounds  Physical Exam:  Vitals: Temperature is 96.5 pulse 63 respiratory rate 20 blood pressure is 101/47 saturations 92%  Ventilator Settings on assist control FiO2 28% tidal volume 410 PEEP 5  . General: Comfortable at this time . Eyes: Grossly normal lids, irises & conjunctiva . ENT: grossly tongue is normal . Neck: no obvious mass . Cardiovascular: S1 S2 normal no gallop . Respiratory: Scattered rhonchi expansion is equal . Abdomen: soft . Skin: no rash seen on limited exam . Musculoskeletal: not rigid . Psychiatric:unable to assess . Neurologic: no seizure no involuntary movements         Lab Data:   Basic Metabolic Panel: Recent Labs  Lab 07/30/20 0924 07/31/20 0625 08/01/20 1021  NA 141  --  136  K 5.4* 5.0 4.5  CL 102  --  97*  CO2 32  --  30  GLUCOSE 204*  --  116*  BUN 51*  --  47*  CREATININE 1.02*  --  0.81  CALCIUM 8.4*  --  8.7*  MG 2.1  --   --     ABG: No results for input(s): PHART, PCO2ART, PO2ART, HCO3, O2SAT in the last 168 hours.  Liver Function Tests: No results for input(s): AST, ALT, ALKPHOS, BILITOT, PROT, ALBUMIN in the last 168 hours. No results for input(s): LIPASE, AMYLASE in the last 168 hours. Recent Labs  Lab 08/01/20 1021  AMMONIA 86*    CBC: Recent Labs  Lab 08/01/20 1021  WBC 13.1*  HGB 10.0*  HCT 34.0*  MCV 91.9  PLT 263    Cardiac Enzymes: No results for input(s): CKTOTAL, CKMB, CKMBINDEX, TROPONINI in the last 168 hours.  BNP (last 3  results) No results for input(s): BNP in the last 8760 hours.  ProBNP (last 3 results) No results for input(s): PROBNP in the last 8760 hours.  Radiological Exams: No results found.  Assessment/Plan Active Problems:   Acute on chronic respiratory failure with hypoxia (HCC)   Chronic atrial fibrillation (HCC)   Metabolic encephalopathy   NASH (nonalcoholic steatohepatitis)   1. Acute on chronic respiratory failure with hypoxia we will continue with the wean protocol today will be a 12-hour pressure support goal 2. Chronic atrial fibrillation rate controlled 3. Metabolic encephalopathy no change supportive care 4. Emily Ho supportive care   I have personally seen and evaluated the patient, evaluated laboratory and imaging results, formulated the assessment and plan and placed orders. The Patient requires high complexity decision making with multiple systems involvement.  Rounds were done with the Respiratory Therapy Director and Staff therapists and discussed with nursing staff also.  Yevonne Pax, MD [facility] Pulmonary Critical Care Medicine Sleep Medicine

## 2020-08-05 DIAGNOSIS — K7581 Nonalcoholic steatohepatitis (NASH): Secondary | ICD-10-CM | POA: Diagnosis not present

## 2020-08-05 DIAGNOSIS — G9341 Metabolic encephalopathy: Secondary | ICD-10-CM | POA: Diagnosis not present

## 2020-08-05 DIAGNOSIS — I482 Chronic atrial fibrillation, unspecified: Secondary | ICD-10-CM | POA: Diagnosis not present

## 2020-08-05 DIAGNOSIS — J9621 Acute and chronic respiratory failure with hypoxia: Secondary | ICD-10-CM | POA: Diagnosis not present

## 2020-08-05 LAB — CBC
HCT: 29.4 % — ABNORMAL LOW (ref 36.0–46.0)
Hemoglobin: 8.5 g/dL — ABNORMAL LOW (ref 12.0–15.0)
MCH: 27.2 pg (ref 26.0–34.0)
MCHC: 28.9 g/dL — ABNORMAL LOW (ref 30.0–36.0)
MCV: 94.2 fL (ref 80.0–100.0)
Platelets: 138 10*3/uL — ABNORMAL LOW (ref 150–400)
RBC: 3.12 MIL/uL — ABNORMAL LOW (ref 3.87–5.11)
RDW: 19.7 % — ABNORMAL HIGH (ref 11.5–15.5)
WBC: 11.2 10*3/uL — ABNORMAL HIGH (ref 4.0–10.5)
nRBC: 0.3 % — ABNORMAL HIGH (ref 0.0–0.2)

## 2020-08-05 LAB — BASIC METABOLIC PANEL
Anion gap: 7 (ref 5–15)
BUN: 49 mg/dL — ABNORMAL HIGH (ref 8–23)
CO2: 30 mmol/L (ref 22–32)
Calcium: 8.1 mg/dL — ABNORMAL LOW (ref 8.9–10.3)
Chloride: 100 mmol/L (ref 98–111)
Creatinine, Ser: 0.72 mg/dL (ref 0.44–1.00)
GFR, Estimated: >60 mL/min (ref >60)
Glucose, Bld: 144 mg/dL — ABNORMAL HIGH (ref 70–99)
Potassium: 4.8 mmol/L (ref 3.5–5.1)
Sodium: 137 mmol/L (ref 135–145)

## 2020-08-05 LAB — AMMONIA: Ammonia: 35 umol/L (ref 9–35)

## 2020-08-05 NOTE — Progress Notes (Signed)
Pulmonary Critical Care Medicine [facility] [place]   PULMONARY CRITICAL CARE SERVICE  PROGRESS NOTE  Date of Service: 08/05/2020  Emily Ho  OBS:[id number]  DOB: 1950-12-21   DOA: 2020-08-16  Referring Physician: [name], MD  HPI: Emily Ho is a 69 y.o. female seen for follow up of Acute on Chronic Respiratory Failure.  She is on the weaning protocol has been on pressure support 12/5 with a 12 to 16-hour goal  Medications: Reviewed on Rounds  Physical Exam:  Vitals: Temperature is 98.1 pulse 83 respiratory 24 blood pressure is 138/70 saturations 100%  Ventilator Settings on pressure support FiO2 28% pressure 12/5  . General: Comfortable at this time . Eyes: Grossly normal lids, irises & conjunctiva . ENT: grossly tongue is normal . Neck: no obvious mass . Cardiovascular: S1 S2 normal no gallop . Respiratory: No rhonchi very coarse breath sounds . Abdomen: soft . Skin: no rash seen on limited exam . Musculoskeletal: not rigid . Psychiatric:unable to assess . Neurologic: no seizure no involuntary movements         Lab Data:   Basic Metabolic Panel: Recent Labs  Lab 07/30/20 0924 07/31/20 0625 08/01/20 1021 08/05/20 0332  NA 141  --  136 137  K 5.4* 5.0 4.5 4.8  CL 102  --  97* 100  CO2 32  --  30 30  GLUCOSE 204*  --  116* 144*  BUN 51*  --  47* 49*  CREATININE 1.02*  --  0.81 0.72  CALCIUM 8.4*  --  8.7* 8.1*  MG 2.1  --   --   --     ABG: No results for input(s): PHART, PCO2ART, PO2ART, HCO3, O2SAT in the last 168 hours.  Liver Function Tests: No results for input(s): AST, ALT, ALKPHOS, BILITOT, PROT, ALBUMIN in the last 168 hours. No results for input(s): LIPASE, AMYLASE in the last 168 hours. Recent Labs  Lab 08/01/20 1021 08/05/20 0332  AMMONIA 86* 35    CBC: Recent Labs  Lab 08/01/20 1021 08/05/20 0332  WBC 13.1* 11.2*  HGB 10.0* 8.5*  HCT 34.0* 29.4*  MCV 91.9 94.2  PLT 263 138*    Cardiac Enzymes: No  results for input(s): CKTOTAL, CKMB, CKMBINDEX, TROPONINI in the last 168 hours.  BNP (last 3 results) No results for input(s): BNP in the last 8760 hours.  ProBNP (last 3 results) No results for input(s): PROBNP in the last 8760 hours.  Radiological Exams: No results found.  Assessment/Plan Active Problems:   Acute on chronic respiratory failure with hypoxia (HCC)   Chronic atrial fibrillation (HCC)   Metabolic encephalopathy   NASH (nonalcoholic steatohepatitis)   1. Acute on chronic respiratory failure hypoxia we will continue with to wean process with pressure for goal up to 16 hours 2. Chronic atrial fibrillation rate is controlled we will monitor 3. Metabolic encephalopathy no change 4. [name] supportive care   I have personally seen and evaluated the patient, evaluated laboratory and imaging results, formulated the assessment and plan and placed orders. The Patient requires high complexity decision making with multiple systems involvement.  Rounds were done with the Respiratory Therapy Director and Staff therapists and discussed with nursing staff also.  Yevonne Pax, MD [facility] Pulmonary Critical Care Medicine Sleep Medicine

## 2020-08-06 ENCOUNTER — Encounter (HOSPITAL_COMMUNITY): Payer: Self-pay | Admitting: Otolaryngology

## 2020-08-06 DIAGNOSIS — K7581 Nonalcoholic steatohepatitis (NASH): Secondary | ICD-10-CM | POA: Diagnosis not present

## 2020-08-06 DIAGNOSIS — J9621 Acute and chronic respiratory failure with hypoxia: Secondary | ICD-10-CM | POA: Diagnosis not present

## 2020-08-06 DIAGNOSIS — I482 Chronic atrial fibrillation, unspecified: Secondary | ICD-10-CM | POA: Diagnosis not present

## 2020-08-06 DIAGNOSIS — G9341 Metabolic encephalopathy: Secondary | ICD-10-CM | POA: Diagnosis not present

## 2020-08-06 NOTE — Progress Notes (Signed)
Pulmonary Critical Care Medicine [facility] [place]   PULMONARY CRITICAL CARE SERVICE  PROGRESS NOTE  Date of Service: 08/06/2020  Emily Ho  WGN:[id number]  DOB: [birth year]   DOA: 08/04/2020  Referring Physician: [name], MD  HPI: Emily Ho is a 69 y.o. female seen for follow up of Acute on Chronic Respiratory Failure.  Patient currently is on T collar has been on 28% FiO2 appears to be doing well with it  Medications: Reviewed on Rounds  Physical Exam:  Vitals: Temperature is 96.3 pulse 89 respiratory rate 20 blood pressure is 165/92 saturations 95%  Ventilator Settings on T collar FiO2 28%  . General: Comfortable at this time . Eyes: Grossly normal lids, irises & conjunctiva . ENT: grossly tongue is normal . Neck: no obvious mass . Cardiovascular: S1 S2 normal no gallop . Respiratory: No rhonchi very coarse breath sounds . Abdomen: soft . Skin: no rash seen on limited exam . Musculoskeletal: not rigid . Psychiatric:unable to assess . Neurologic: no seizure no involuntary movements         Lab Data:   Basic Metabolic Panel: Recent Labs  Lab 07/31/20 0625 08/01/20 1021 08/05/20 0332  NA  --  136 137  K 5.0 4.5 4.8  CL  --  97* 100  CO2  --  30 30  GLUCOSE  --  116* 144*  BUN  --  47* 49*  CREATININE  --  0.81 0.72  CALCIUM  --  8.7* 8.1*    ABG: No results for input(s): PHART, PCO2ART, PO2ART, HCO3, O2SAT in the last 168 hours.  Liver Function Tests: No results for input(s): AST, ALT, ALKPHOS, BILITOT, PROT, ALBUMIN in the last 168 hours. No results for input(s): LIPASE, AMYLASE in the last 168 hours. Recent Labs  Lab 08/01/20 1021 08/05/20 0332  AMMONIA 86* 35    CBC: Recent Labs  Lab 08/01/20 1021 08/05/20 0332  WBC 13.1* 11.2*  HGB 10.0* 8.5*  HCT 34.0* 29.4*  MCV 91.9 94.2  PLT 263 138*    Cardiac Enzymes: No results for input(s): CKTOTAL, CKMB, CKMBINDEX, TROPONINI in the last 168 hours.  BNP (last 3  results) No results for input(s): BNP in the last 8760 hours.  ProBNP (last 3 results) No results for input(s): PROBNP in the last 8760 hours.  Radiological Exams: No results found.  Assessment/Plan Active Problems:   Acute on chronic respiratory failure with hypoxia (HCC)   Chronic atrial fibrillation (HCC)   Metabolic encephalopathy   NASH (nonalcoholic steatohepatitis)   1. Acute on chronic respiratory failure hypoxia we will wean for 4 hours on T collar today. 2. Chronic atrial fibrillation rate is controlled. 3. Metabolic encephalopathy supportive care improving slow 4. Emily Ho supportive care   I have personally seen and evaluated the patient, evaluated laboratory and imaging results, formulated the assessment and plan and placed orders. The Patient requires high complexity decision making with multiple systems involvement.  Rounds were done with the Respiratory Therapy Director and Staff therapists and discussed with nursing staff also.  Yevonne Pax, MD [facility] Pulmonary Critical Care Medicine Sleep Medicine

## 2020-08-07 DIAGNOSIS — G9341 Metabolic encephalopathy: Secondary | ICD-10-CM | POA: Diagnosis not present

## 2020-08-07 DIAGNOSIS — I482 Chronic atrial fibrillation, unspecified: Secondary | ICD-10-CM | POA: Diagnosis not present

## 2020-08-07 DIAGNOSIS — J9621 Acute and chronic respiratory failure with hypoxia: Secondary | ICD-10-CM | POA: Diagnosis not present

## 2020-08-07 DIAGNOSIS — K7581 Nonalcoholic steatohepatitis (NASH): Secondary | ICD-10-CM | POA: Diagnosis not present

## 2020-08-07 NOTE — Progress Notes (Addendum)
Pulmonary Critical Care Medicine [facility] [place]   PULMONARY CRITICAL CARE SERVICE  PROGRESS NOTE  Date of Service: 08/07/2020  Emily Ho  PYK:[id number]  DOB: 05/03/1951   DOA: 08/02/2020  Referring Physician: [name], MD  HPI: Emily Ho is a 69 y.o. female seen for follow up of Acute on Chronic Respiratory Failure.  Patient has an 8-hour goal today on aerosol trach collar 28% satting well no fever or distress.  Medications: Reviewed on Rounds  Physical Exam:  Vitals: Pulse 94 respirations 20 BP 172/47 O2 sat 96% temp 96.5  Ventilator Settings 28% ATC  . General: Comfortable at this time . Eyes: Grossly normal lids, irises & conjunctiva . ENT: grossly tongue is normal . Neck: no obvious mass . Cardiovascular: S1 S2 normal no gallop . Respiratory: No rales or rhonchi noted . Abdomen: soft . Skin: no rash seen on limited exam . Musculoskeletal: not rigid . Psychiatric:unable to assess . Neurologic: no seizure no involuntary movements         Lab Data:   Basic Metabolic Panel: Recent Labs  Lab 08/01/20 1021 08/05/20 0332  NA 136 137  K 4.5 4.8  CL 97* 100  CO2 30 30  GLUCOSE 116* 144*  BUN 47* 49*  CREATININE 0.81 0.72  CALCIUM 8.7* 8.1*    ABG: No results for input(s): PHART, PCO2ART, PO2ART, HCO3, O2SAT in the last 168 hours.  Liver Function Tests: No results for input(s): AST, ALT, ALKPHOS, BILITOT, PROT, ALBUMIN in the last 168 hours. No results for input(s): LIPASE, AMYLASE in the last 168 hours. Recent Labs  Lab 08/01/20 1021 08/05/20 0332  AMMONIA 86* 35    CBC: Recent Labs  Lab 08/01/20 1021 08/05/20 0332  WBC 13.1* 11.2*  HGB 10.0* 8.5*  HCT 34.0* 29.4*  MCV 91.9 94.2  PLT 263 138*    Cardiac Enzymes: No results for input(s): CKTOTAL, CKMB, CKMBINDEX, TROPONINI in the last 168 hours.  BNP (last 3 results) No results for input(s): BNP in the last 8760 hours.  ProBNP (last 3 results) No results for  input(s): PROBNP in the last 8760 hours.  Radiological Exams: No results found.  Assessment/Plan Active Problems:   Acute on chronic respiratory failure with hypoxia (HCC)   Chronic atrial fibrillation (HCC)   Metabolic encephalopathy   NASH (nonalcoholic steatohepatitis)   1. Acute on chronic respiratory failure hypoxia we will wean for 8 hours on T collar today.  Continue aggressive pulmonary toilet supportive measures. 2. Chronic atrial fibrillation rate is controlled. 3. Metabolic encephalopathy supportive care improving slow 4. Emily Ho, continue supportive care   I have personally seen and evaluated the patient, evaluated laboratory and imaging results, formulated the assessment and plan and placed orders. The Patient requires high complexity decision making with multiple systems involvement.  Rounds were done with the Respiratory Therapy Director and Staff therapists and discussed with nursing staff also.  Yevonne Pax, MD [facility] Pulmonary Critical Care Medicine Sleep Medicine

## 2020-08-08 DIAGNOSIS — G9341 Metabolic encephalopathy: Secondary | ICD-10-CM | POA: Diagnosis not present

## 2020-08-08 DIAGNOSIS — J9621 Acute and chronic respiratory failure with hypoxia: Secondary | ICD-10-CM | POA: Diagnosis not present

## 2020-08-08 DIAGNOSIS — I482 Chronic atrial fibrillation, unspecified: Secondary | ICD-10-CM | POA: Diagnosis not present

## 2020-08-08 DIAGNOSIS — K7581 Nonalcoholic steatohepatitis (NASH): Secondary | ICD-10-CM | POA: Diagnosis not present

## 2020-08-08 NOTE — Progress Notes (Addendum)
Pulmonary Critical Care Medicine [facility] [place]   PULMONARY CRITICAL CARE SERVICE  PROGRESS NOTE  Date of Service: 08/08/2020  Emily Ho  VCB:[id number]  DOB: July 03, 1951   DOA: 08-17-2020  Referring Physician: [name], MD  HPI: Emily Ho is a 69 y.o. female seen for follow up of Acute on Chronic Respiratory Failure.  Patient remains on aerosol trach collar at this time for 16-hour goal satting well no distress.  Medications: Reviewed on Rounds  Physical Exam:  Vitals: Pulse 111 respirations 18 BP 137/67 O2 sat 100% temp 95.9  Ventilator Settings not currently on ventilator  . General: Comfortable at this time . Eyes: Grossly normal lids, irises & conjunctiva . ENT: grossly tongue is normal . Neck: no obvious mass . Cardiovascular: S1 S2 normal no gallop . Respiratory: No rales or rhonchi noted . Abdomen: soft . Skin: no rash seen on limited exam . Musculoskeletal: not rigid . Psychiatric:unable to assess . Neurologic: no seizure no involuntary movements         Lab Data:   Basic Metabolic Panel: Recent Labs  Lab 08/05/20 0332  NA 137  K 4.8  CL 100  CO2 30  GLUCOSE 144*  BUN 49*  CREATININE 0.72  CALCIUM 8.1*    ABG: No results for input(s): PHART, PCO2ART, PO2ART, HCO3, O2SAT in the last 168 hours.  Liver Function Tests: No results for input(s): AST, ALT, ALKPHOS, BILITOT, PROT, ALBUMIN in the last 168 hours. No results for input(s): LIPASE, AMYLASE in the last 168 hours. Recent Labs  Lab 08/05/20 0332  AMMONIA 35    CBC: Recent Labs  Lab 08/05/20 0332  WBC 11.2*  HGB 8.5*  HCT 29.4*  MCV 94.2  PLT 138*    Cardiac Enzymes: No results for input(s): CKTOTAL, CKMB, CKMBINDEX, TROPONINI in the last 168 hours.  BNP (last 3 results) No results for input(s): BNP in the last 8760 hours.  ProBNP (last 3 results) No results for input(s): PROBNP in the last 8760 hours.  Radiological Exams: No results  found.  Assessment/Plan Active Problems:   Acute on chronic respiratory failure with hypoxia (HCC)   Chronic atrial fibrillation (HCC)   Metabolic encephalopathy   NASH (nonalcoholic steatohepatitis)   1. Acute on chronic respiratory failure hypoxia patient 16-hour goal today on aerosol trach collar 28% currently satting well continue supportive measures pulmonary toilet. 2. Chronic atrial fibrillation rate is controlled. 3. Metabolic encephalopathy supportive care improving slow 4. [name], continue supportive care   I have personally seen and evaluated the patient, evaluated laboratory and imaging results, formulated the assessment and plan and placed orders. The Patient requires high complexity decision making with multiple systems involvement.  Rounds were done with the Respiratory Therapy Director and Staff therapists and discussed with nursing staff also.  Yevonne Pax, MD [facility] Pulmonary Critical Care Medicine Sleep Medicine

## 2020-08-09 ENCOUNTER — Other Ambulatory Visit (HOSPITAL_COMMUNITY): Payer: Medicare Other

## 2020-08-09 DIAGNOSIS — I482 Chronic atrial fibrillation, unspecified: Secondary | ICD-10-CM | POA: Diagnosis not present

## 2020-08-09 DIAGNOSIS — G9341 Metabolic encephalopathy: Secondary | ICD-10-CM | POA: Diagnosis not present

## 2020-08-09 DIAGNOSIS — K7581 Nonalcoholic steatohepatitis (NASH): Secondary | ICD-10-CM | POA: Diagnosis not present

## 2020-08-09 DIAGNOSIS — J9621 Acute and chronic respiratory failure with hypoxia: Secondary | ICD-10-CM | POA: Diagnosis not present

## 2020-08-09 LAB — CBC
HCT: 33.5 % — ABNORMAL LOW (ref 36.0–46.0)
Hemoglobin: 9.7 g/dL — ABNORMAL LOW (ref 12.0–15.0)
MCH: 26.9 pg (ref 26.0–34.0)
MCHC: 29 g/dL — ABNORMAL LOW (ref 30.0–36.0)
MCV: 93.1 fL (ref 80.0–100.0)
Platelets: 194 10*3/uL (ref 150–400)
RBC: 3.6 MIL/uL — ABNORMAL LOW (ref 3.87–5.11)
RDW: 19.9 % — ABNORMAL HIGH (ref 11.5–15.5)
WBC: 13.7 10*3/uL — ABNORMAL HIGH (ref 4.0–10.5)
nRBC: 0.7 % — ABNORMAL HIGH (ref 0.0–0.2)

## 2020-08-09 LAB — BLOOD GAS, ARTERIAL
Acid-Base Excess: 9.6 mmol/L — ABNORMAL HIGH (ref 0.0–2.0)
Bicarbonate: 34.1 mmol/L — ABNORMAL HIGH (ref 20.0–28.0)
FIO2: 28
O2 Saturation: 97.2 %
Patient temperature: 36.8
pCO2 arterial: 50.1 mmHg — ABNORMAL HIGH (ref 32.0–48.0)
pH, Arterial: 7.447 (ref 7.350–7.450)
pO2, Arterial: 92.2 mmHg (ref 83.0–108.0)

## 2020-08-09 LAB — BASIC METABOLIC PANEL
Anion gap: 12 (ref 5–15)
BUN: 59 mg/dL — ABNORMAL HIGH (ref 8–23)
CO2: 29 mmol/L (ref 22–32)
Calcium: 8.6 mg/dL — ABNORMAL LOW (ref 8.9–10.3)
Chloride: 101 mmol/L (ref 98–111)
Creatinine, Ser: 0.86 mg/dL (ref 0.44–1.00)
GFR, Estimated: >60 mL/min (ref >60)
Glucose, Bld: 232 mg/dL — ABNORMAL HIGH (ref 70–99)
Potassium: 4.9 mmol/L (ref 3.5–5.1)
Sodium: 142 mmol/L (ref 135–145)

## 2020-08-09 LAB — AMMONIA: Ammonia: 64 umol/L — ABNORMAL HIGH (ref 9–35)

## 2020-08-09 NOTE — Progress Notes (Addendum)
Pulmonary Critical Care Medicine [facility] [place]   PULMONARY CRITICAL CARE SERVICE  PROGRESS NOTE  Date of Service: 08/09/2020  Emily Ho  WER:[id number]  DOB: 04/23/1951   DOA: 08/10/2020  Referring Physician: [name], MD  HPI: Emily Ho is a 69 y.o. female seen for follow up of Acute on Chronic Respiratory Failure.  Patient is currently on aerosol trach collar for 28-hour goal today satting well no distress.  Medications: Reviewed on Rounds  Physical Exam:  Vitals: Pulse 136 respirations 24 BP 117/97 O2 sat 97% temp 98.4  Ventilator Settings not currently on ventilator  . General: Comfortable at this time . Eyes: Grossly normal lids, irises & conjunctiva . ENT: grossly tongue is normal . Neck: no obvious mass . Cardiovascular: S1 S2 normal no gallop . Respiratory: No rales or rhonchi noted . Abdomen: soft . Skin: no rash seen on limited exam . Musculoskeletal: not rigid . Psychiatric:unable to assess . Neurologic: no seizure no involuntary movements         Lab Data:   Basic Metabolic Panel: Recent Labs  Lab 08/05/20 0332 08/09/20 0454  NA 137 142  K 4.8 4.9  CL 100 101  CO2 30 29  GLUCOSE 144* 232*  BUN 49* 59*  CREATININE 0.72 0.86  CALCIUM 8.1* 8.6*    ABG: No results for input(s): PHART, PCO2ART, PO2ART, HCO3, O2SAT in the last 168 hours.  Liver Function Tests: No results for input(s): AST, ALT, ALKPHOS, BILITOT, PROT, ALBUMIN in the last 168 hours. No results for input(s): LIPASE, AMYLASE in the last 168 hours. Recent Labs  Lab 08/05/20 0332 08/09/20 0454  AMMONIA 35 64*    CBC: Recent Labs  Lab 08/05/20 0332 08/09/20 0454  WBC 11.2* 13.7*  HGB 8.5* 9.7*  HCT 29.4* 33.5*  MCV 94.2 93.1  PLT 138* 194    Cardiac Enzymes: No results for input(s): CKTOTAL, CKMB, CKMBINDEX, TROPONINI in the last 168 hours.  BNP (last 3 results) No results for input(s): BNP in the last 8760 hours.  ProBNP (last 3  results) No results for input(s): PROBNP in the last 8760 hours.  Radiological Exams: No results found.  Assessment/Plan Active Problems:   Acute on chronic respiratory failure with hypoxia (HCC)   Chronic atrial fibrillation (HCC)   Metabolic encephalopathy   NASH (nonalcoholic steatohepatitis)   1. Acute on chronic respiratory failure hypoxia  patient will continue to wean for 20-hour goal on aerosol trach collar 20% FiO2 we will continue aggressive pulmonary toilet supportive measures. 2. Chronic atrial fibrillation rate is controlled. 3. Metabolic encephalopathy supportive care improving slow 4. Nash,continuesupportive care   I have personally seen and evaluated the patient, evaluated laboratory and imaging results, formulated the assessment and plan and placed orders. The Patient requires high complexity decision making with multiple systems involvement.  Rounds were done with the Respiratory Therapy Director and Staff therapists and discussed with nursing staff also.  Yevonne Pax, MD [facility] Pulmonary Critical Care Medicine Sleep Medicine

## 2020-08-10 ENCOUNTER — Other Ambulatory Visit (HOSPITAL_COMMUNITY): Payer: Medicare Other

## 2020-08-10 DIAGNOSIS — I482 Chronic atrial fibrillation, unspecified: Secondary | ICD-10-CM | POA: Diagnosis not present

## 2020-08-10 DIAGNOSIS — J9621 Acute and chronic respiratory failure with hypoxia: Secondary | ICD-10-CM | POA: Diagnosis not present

## 2020-08-10 DIAGNOSIS — G9341 Metabolic encephalopathy: Secondary | ICD-10-CM | POA: Diagnosis not present

## 2020-08-10 DIAGNOSIS — K7581 Nonalcoholic steatohepatitis (NASH): Secondary | ICD-10-CM | POA: Diagnosis not present

## 2020-08-10 NOTE — Progress Notes (Addendum)
Pulmonary Critical Care Medicine [facility] [place]   PULMONARY CRITICAL CARE SERVICE  PROGRESS NOTE  Date of Service: 08/10/2020  Emily Ho  VVO:[id number]  DOB: [birth year]   DOA: 08/05/2020  Referring Physician: [name], MD  HPI: Emily Ho is a 69 y.o. female seen for follow up of Acute on Chronic Respiratory Failure.  Patient continues on aerosol trach collar 28% FiO2 for 24-hour goal today and satting well no distress.  Medications: Reviewed on Rounds  Physical Exam:  Vitals: Pulse 102 respirations 20 BP 106/64 O2 sat 98% temp 97.4  Ventilator Settings not currently on ventilator  . General: Comfortable at this time . Eyes: Grossly normal lids, irises & conjunctiva . ENT: grossly tongue is normal . Neck: no obvious mass . Cardiovascular: S1 S2 normal no gallop . Respiratory: No rales or rhonchi noted . Abdomen: soft . Skin: no rash seen on limited exam . Musculoskeletal: not rigid . Psychiatric:unable to assess . Neurologic: no seizure no involuntary movements         Lab Data:   Basic Metabolic Panel: Recent Labs  Lab 08/05/20 0332 08/09/20 0454  NA 137 142  K 4.8 4.9  CL 100 101  CO2 30 29  GLUCOSE 144* 232*  BUN 49* 59*  CREATININE 0.72 0.86  CALCIUM 8.1* 8.6*    ABG: Recent Labs  Lab 08/09/20 1815  PHART 7.447  PCO2ART 50.1*  PO2ART 92.2  HCO3 34.1*  O2SAT 97.2    Liver Function Tests: No results for input(s): AST, ALT, ALKPHOS, BILITOT, PROT, ALBUMIN in the last 168 hours. No results for input(s): LIPASE, AMYLASE in the last 168 hours. Recent Labs  Lab 08/05/20 0332 08/09/20 0454  AMMONIA 35 64*    CBC: Recent Labs  Lab 08/05/20 0332 08/09/20 0454  WBC 11.2* 13.7*  HGB 8.5* 9.7*  HCT 29.4* 33.5*  MCV 94.2 93.1  PLT 138* 194    Cardiac Enzymes: No results for input(s): CKTOTAL, CKMB, CKMBINDEX, TROPONINI in the last 168 hours.  BNP (last 3 results) No results for input(s): BNP in the last 8760  hours.  ProBNP (last 3 results) No results for input(s): PROBNP in the last 8760 hours.  Radiological Exams: DG CHEST PORT 1 VIEW  Result Date: 08/09/2020 CLINICAL DATA:  Fever EXAM: PORTABLE CHEST 1 VIEW COMPARISON:  07/30/2020 FINDINGS: Tracheostomy. Patient rotated left. Normal heart size for level of inspiration. Layering right pleural effusion, increased. No pneumothorax. Right sided airspace disease is mid and lower lung predominant. Minimal left base atelectasis. Removal of left PICC line. IMPRESSION: Significantly worsened right-sided aeration with increased layering pleural effusion and lower lung predominant airspace disease. Atelectasis or infection. Cardiomegaly without overt congestive failure. Minimal left base atelectasis. Electronically Signed   By: Jeronimo Greaves M.D.   On: 08/09/2020 11:48    Assessment/Plan Active Problems:   Acute on chronic respiratory failure with hypoxia (HCC)   Chronic atrial fibrillation (HCC)   Metabolic encephalopathy   NASH (nonalcoholic steatohepatitis)   1. Acute on chronic respiratory failure hypoxiapatient will continue towards a 24-hour goal on aerosol trach collar 28% we will continue supportive measures and pulmonary toilet. 2. Chronic atrial fibrillation rate is controlled. 3. Metabolic encephalopathy supportive care improving slow 4. Nash,continuesupportive care   I have personally seen and evaluated the patient, evaluated laboratory and imaging results, formulated the assessment and plan and placed orders. The Patient requires high complexity decision making with multiple systems involvement.  Rounds were done with the Respiratory Therapy  Librarian, academic therapists and discussed with nursing staff also.  Allyne Gee, MD [facility] Pulmonary Critical Care Medicine Sleep Medicine

## 2020-08-11 DIAGNOSIS — K7581 Nonalcoholic steatohepatitis (NASH): Secondary | ICD-10-CM | POA: Diagnosis not present

## 2020-08-11 DIAGNOSIS — J9621 Acute and chronic respiratory failure with hypoxia: Secondary | ICD-10-CM | POA: Diagnosis not present

## 2020-08-11 DIAGNOSIS — I482 Chronic atrial fibrillation, unspecified: Secondary | ICD-10-CM | POA: Diagnosis not present

## 2020-08-11 DIAGNOSIS — G9341 Metabolic encephalopathy: Secondary | ICD-10-CM | POA: Diagnosis not present

## 2020-08-11 LAB — BASIC METABOLIC PANEL
Anion gap: 9 (ref 5–15)
BUN: 54 mg/dL — ABNORMAL HIGH (ref 8–23)
CO2: 31 mmol/L (ref 22–32)
Calcium: 8.1 mg/dL — ABNORMAL LOW (ref 8.9–10.3)
Chloride: 102 mmol/L (ref 98–111)
Creatinine, Ser: 0.76 mg/dL (ref 0.44–1.00)
GFR, Estimated: >60 mL/min (ref >60)
Glucose, Bld: 166 mg/dL — ABNORMAL HIGH (ref 70–99)
Potassium: 4.3 mmol/L (ref 3.5–5.1)
Sodium: 142 mmol/L (ref 135–145)

## 2020-08-11 LAB — AMMONIA: Ammonia: 47 umol/L — ABNORMAL HIGH (ref 9–35)

## 2020-08-11 NOTE — Progress Notes (Signed)
Pulmonary Critical Care Medicine [facility] [place]   PULMONARY CRITICAL CARE SERVICE  PROGRESS NOTE  Date of Service: 08/11/2020  Emily Ho  WYO:[id number]  DOB: November 04, 1950   DOA: 08/15/2020  Referring Physician: [name], MD  HPI: Emily Ho is a 69 y.o. female seen for follow up of Acute on Chronic Respiratory Failure.  Patient currently is on T collar has been on 20% FiO2 good saturations are noted  Medications: Reviewed on Rounds  Physical Exam:  Vitals: Temperature is 96.7 pulse 109 respiratory rate 21 blood pressure is 134/77 saturations 97%  Ventilator Settings on T collar FiO2 28%  . General: Comfortable at this time . Eyes: Grossly normal lids, irises & conjunctiva . ENT: grossly tongue is normal . Neck: no obvious mass . Cardiovascular: S1 S2 normal no gallop . Respiratory: No rhonchi very coarse breath sounds . Abdomen: soft . Skin: no rash seen on limited exam . Musculoskeletal: not rigid . Psychiatric:unable to assess . Neurologic: no seizure no involuntary movements         Lab Data:   Basic Metabolic Panel: Recent Labs  Lab 08/05/20 0332 08/09/20 0454 08/11/20 0500  NA 137 142 142  K 4.8 4.9 4.3  CL 100 101 102  CO2 30 29 31   GLUCOSE 144* 232* 166*  BUN 49* 59* 54*  CREATININE 0.72 0.86 0.76  CALCIUM 8.1* 8.6* 8.1*    ABG: Recent Labs  Lab 08/09/20 1815  PHART 7.447  PCO2ART 50.1*  PO2ART 92.2  HCO3 34.1*  O2SAT 97.2    Liver Function Tests: No results for input(s): AST, ALT, ALKPHOS, BILITOT, PROT, ALBUMIN in the last 168 hours. No results for input(s): LIPASE, AMYLASE in the last 168 hours. Recent Labs  Lab 08/05/20 0332 08/09/20 0454 08/11/20 0500  AMMONIA 35 64* 47*    CBC: Recent Labs  Lab 08/05/20 0332 08/09/20 0454  WBC 11.2* 13.7*  HGB 8.5* 9.7*  HCT 29.4* 33.5*  MCV 94.2 93.1  PLT 138* 194    Cardiac Enzymes: No results for input(s): CKTOTAL, CKMB, CKMBINDEX, TROPONINI in the last  168 hours.  BNP (last 3 results) No results for input(s): BNP in the last 8760 hours.  ProBNP (last 3 results) No results for input(s): PROBNP in the last 8760 hours.  Radiological Exams: DG CHEST PORT 1 VIEW  Result Date: 08/09/2020 CLINICAL DATA:  Fever EXAM: PORTABLE CHEST 1 VIEW COMPARISON:  07/30/2020 FINDINGS: Tracheostomy. Patient rotated left. Normal heart size for level of inspiration. Layering right pleural effusion, increased. No pneumothorax. Right sided airspace disease is mid and lower lung predominant. Minimal left base atelectasis. Removal of left PICC line. IMPRESSION: Significantly worsened right-sided aeration with increased layering pleural effusion and lower lung predominant airspace disease. Atelectasis or infection. Cardiomegaly without overt congestive failure. Minimal left base atelectasis. Electronically Signed   By: 09/29/2020 M.D.   On: 08/09/2020 11:48    Assessment/Plan Active Problems:   Acute on chronic respiratory failure with hypoxia (HCC)   Chronic atrial fibrillation (HCC)   Metabolic encephalopathy   NASH (nonalcoholic steatohepatitis)   1. Acute on chronic respiratory failure with hypoxia we will continue with the weaning process advance on T collar as tolerated right now is only requiring 20% FiO2. 2. Chronic atrial fibrillation rate is controlled we will continue supportive care 3. Metabolic encephalopathy slowly improved 4. 08/11/2020 continue with supportive care   I have personally seen and evaluated the patient, evaluated laboratory and imaging results, formulated the assessment  and plan and placed orders. The Patient requires high complexity decision making with multiple systems involvement.  Rounds were done with the Respiratory Therapy Director and Staff therapists and discussed with nursing staff also.  Allyne Gee, MD [facility] Pulmonary Critical Care Medicine Sleep Medicine

## 2020-08-12 ENCOUNTER — Other Ambulatory Visit (HOSPITAL_COMMUNITY): Payer: Medicare Other

## 2020-08-12 DIAGNOSIS — G9341 Metabolic encephalopathy: Secondary | ICD-10-CM | POA: Diagnosis not present

## 2020-08-12 DIAGNOSIS — J9621 Acute and chronic respiratory failure with hypoxia: Secondary | ICD-10-CM | POA: Diagnosis not present

## 2020-08-12 DIAGNOSIS — K7581 Nonalcoholic steatohepatitis (NASH): Secondary | ICD-10-CM | POA: Diagnosis not present

## 2020-08-12 DIAGNOSIS — I482 Chronic atrial fibrillation, unspecified: Secondary | ICD-10-CM | POA: Diagnosis not present

## 2020-08-12 NOTE — Progress Notes (Addendum)
Pulmonary Critical Care Medicine [facility] [place]   PULMONARY CRITICAL CARE SERVICE  PROGRESS NOTE  Date of Service: 08/12/2020  Emily Ho  OIT:[id number]  DOB: 1951/05/31   DOA: 2020-08-27  Referring Physician: [name], MD  HPI: Emily Ho is a 69 y.o. female seen for follow up of Acute on Chronic Respiratory Failure.  Patient mains on 28% T-bar satting well no distress.  Medications: Reviewed on Rounds  Physical Exam:  Vitals: Pulse 113 respirations 21 BP 143/81 O2 sat 99% temp 96.7  Ventilator Settings not currently on ventilator  . General: Comfortable at this time . Eyes: Grossly normal lids, irises & conjunctiva . ENT: grossly tongue is normal . Neck: no obvious mass . Cardiovascular: S1 S2 normal no gallop . Respiratory: No rales or rhonchi noted . Abdomen: soft . Skin: no rash seen on limited exam . Musculoskeletal: not rigid . Psychiatric:unable to assess . Neurologic: no seizure no involuntary movements         Lab Data:   Basic Metabolic Panel: Recent Labs  Lab 08/09/20 0454 08/11/20 0500  NA 142 142  K 4.9 4.3  CL 101 102  CO2 29 31  GLUCOSE 232* 166*  BUN 59* 54*  CREATININE 0.86 0.76  CALCIUM 8.6* 8.1*    ABG: Recent Labs  Lab 08/09/20 1815  PHART 7.447  PCO2ART 50.1*  PO2ART 92.2  HCO3 34.1*  O2SAT 97.2    Liver Function Tests: No results for input(s): AST, ALT, ALKPHOS, BILITOT, PROT, ALBUMIN in the last 168 hours. No results for input(s): LIPASE, AMYLASE in the last 168 hours. Recent Labs  Lab 08/09/20 0454 08/11/20 0500  AMMONIA 64* 47*    CBC: Recent Labs  Lab 08/09/20 0454  WBC 13.7*  HGB 9.7*  HCT 33.5*  MCV 93.1  PLT 194    Cardiac Enzymes: No results for input(s): CKTOTAL, CKMB, CKMBINDEX, TROPONINI in the last 168 hours.  BNP (last 3 results) No results for input(s): BNP in the last 8760 hours.  ProBNP (last 3 results) No results for input(s): PROBNP in the last 8760  hours.  Radiological Exams: CT ABDOMEN WO CONTRAST  Result Date: 08/11/2020 CLINICAL DATA:  69 year old female with planned gastrostomy tube placement. EXAM: CT ABDOMEN WITHOUT CONTRAST TECHNIQUE: Multidetector CT imaging of the abdomen was performed following the standard protocol without IV contrast. COMPARISON:  03/16/2012 FINDINGS: Lower chest: Large right pleural effusion with associated passive atelectasis of the visualized right middle and lower lobes. Trace left pleural effusion with associated basilar passive atelectasis. Mild global cardiomegaly. No pericardial effusion. Severe coronary atherosclerotic calcifications and scattered atherosclerotic calcifications of the visualized thoracic aorta. Hepatobiliary: Mild nodular contour and globally shrunken appearance of the liver. Normal attenuation. No focal lesions identified. The gallbladder is present. No intra or extrahepatic biliary ductal dilation. Pancreas: Fatty atrophy. No pancreatic ductal dilatation or surrounding inflammatory changes. Spleen: Normal in size without focal abnormality. Adrenals/Urinary Tract: Unchanged left adrenal nodule measuring up to 3 cm with average Hounsfield units of 21. The kidneys are symmetrically mildly atrophic with scattered multifocal subcentimeter renal cysts, some with hemorrhagic components, similar to comparison. No hydronephrosis or nephrolithiasis. Stomach/Bowel: Gastric decompression tube is in place with the tip terminating in the gastric body. The stomach is decompressed. Normal course and caliber the visualized small bowel. The visualized transverse colon is positioned inferior to the level of the stomach. Vascular/Lymphatic: Scattered atherosclerotic calcifications of the normal caliber abdominal aorta. Ostial atherosclerotic calcifications of the visceral branches. Other: Large  volume ascites, with the largest volume in the right lower quadrant. There is small volume ascites surrounding the stomach.  Musculoskeletal: No acute or significant osseous findings. IMPRESSION: 1. Favorable anatomy of the stomach for percutaneous gastrostomy tube placement. However, presence of large volume ascites has a known increased risk of complications and mortality. 2. Large right pleural effusion with associated passive atelectasis of the visualized lung. 3. 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 4. Unchanged left adrenal nodule measuring up to approximately 3 cm, favored represent benign lesion such as lipid poor adenoma given stability, however indeterminate on this noncontrast study. 5.  Aortic Atherosclerosis (ICD10-I70.0). Marliss Coots, MD [facility] Radiology Electronically Signed   By: Marliss Coots MD   On: 08/11/2020 08:51   DG CHEST PORT 1 VIEW  Result Date: 08/12/2020 CLINICAL DATA:  Pleural effusion EXAM: PORTABLE CHEST 1 VIEW COMPARISON:  08/09/2020 FINDINGS: Tracheostomy in satisfactory position. Low lung volumes. Moderate layering right pleural effusion. Bilateral lower lobe opacities, likely atelectasis. Superimposed airspace opacity in the right upper lobe is not excluded, but this appearance may simply reflect layering effusion/atelectasis in semirecumbent patient. No pneumothorax. Overall, this appearance is unchanged. Cardiomegaly. IMPRESSION: Moderate layering right pleural effusion. Bilateral lower lobe opacities, likely atelectasis. Superimposed right upper lobe pneumonia is possible but is not favored. Overall appearance is unchanged. Electronically Signed   By: Charline Bills M.D.   On: 08/12/2020 06:09    Assessment/Plan Active Problems:   Acute on chronic respiratory failure with hypoxia (HCC)   Chronic atrial fibrillation (HCC)   Metabolic encephalopathy   NASH (nonalcoholic steatohepatitis)   1. Acute on chronic respiratory failure with hypoxia patient will continue on T-bar 20% this time continue  aggressive pulmonary toilet supportive measures. 2. Chronic atrial fibrillation rate is controlled we will continue supportive care 3. Metabolic encephalopathy slowly improved 4. [name] continue with supportive care   I have personally seen and evaluated the patient, evaluated laboratory and imaging results, formulated the assessment and plan and placed orders. The Patient requires high complexity decision making with multiple systems involvement.  Rounds were done with the Respiratory Therapy Director and Staff therapists and discussed with nursing staff also.  Yevonne Pax, MD [facility] Pulmonary Critical Care Medicine Sleep Medicine

## 2020-08-13 DIAGNOSIS — I482 Chronic atrial fibrillation, unspecified: Secondary | ICD-10-CM | POA: Diagnosis not present

## 2020-08-13 DIAGNOSIS — J9621 Acute and chronic respiratory failure with hypoxia: Secondary | ICD-10-CM | POA: Diagnosis not present

## 2020-08-13 DIAGNOSIS — K7581 Nonalcoholic steatohepatitis (NASH): Secondary | ICD-10-CM | POA: Diagnosis not present

## 2020-08-13 DIAGNOSIS — G9341 Metabolic encephalopathy: Secondary | ICD-10-CM | POA: Diagnosis not present

## 2020-08-13 NOTE — Progress Notes (Signed)
Pulmonary Critical Care Medicine [facility] [place]   PULMONARY CRITICAL CARE SERVICE  PROGRESS NOTE  Date of Service: 08/13/2020  SHAMERE DILWORTH  GYJ:[id number]  DOB: 08/20/1951   DOA: 08/12/2020  Referring Physician: [name], MD  HPI: Emily Ho is a 69 y.o. female seen for follow up of Acute on Chronic Respiratory Failure.  Patient currently is on T collar has been on 20% FiO2 small amount of secretions are noted.  Medications: Reviewed on Rounds  Physical Exam:  Vitals: Temperature is 97.6 pulse 113 respiratory rate 21 blood pressure is 127/58 saturations 99%  Ventilator Settings patient is on T collar currently 20% FiO2  . General: Comfortable at this time . Eyes: Grossly normal lids, irises & conjunctiva . ENT: grossly tongue is normal . Neck: no obvious mass . Cardiovascular: S1 S2 normal no gallop . Respiratory: Scattered rhonchi very coarse breath sounds . Abdomen: soft . Skin: no rash seen on limited exam . Musculoskeletal: not rigid . Psychiatric:unable to assess . Neurologic: no seizure no involuntary movements         Lab Data:   Basic Metabolic Panel: Recent Labs  Lab 08/09/20 0454 08/11/20 0500  NA 142 142  K 4.9 4.3  CL 101 102  CO2 29 31  GLUCOSE 232* 166*  BUN 59* 54*  CREATININE 0.86 0.76  CALCIUM 8.6* 8.1*    ABG: Recent Labs  Lab 08/09/20 1815  PHART 7.447  PCO2ART 50.1*  PO2ART 92.2  HCO3 34.1*  O2SAT 97.2    Liver Function Tests: No results for input(s): AST, ALT, ALKPHOS, BILITOT, PROT, ALBUMIN in the last 168 hours. No results for input(s): LIPASE, AMYLASE in the last 168 hours. Recent Labs  Lab 08/09/20 0454 08/11/20 0500  AMMONIA 64* 47*    CBC: Recent Labs  Lab 08/09/20 0454  WBC 13.7*  HGB 9.7*  HCT 33.5*  MCV 93.1  PLT 194    Cardiac Enzymes: No results for input(s): CKTOTAL, CKMB, CKMBINDEX, TROPONINI in the last 168 hours.  BNP (last 3 results) No results for input(s): BNP in the  last 8760 hours.  ProBNP (last 3 results) No results for input(s): PROBNP in the last 8760 hours.  Radiological Exams: DG CHEST PORT 1 VIEW  Result Date: 08/12/2020 CLINICAL DATA:  Pleural effusion EXAM: PORTABLE CHEST 1 VIEW COMPARISON:  08/09/2020 FINDINGS: Tracheostomy in satisfactory position. Low lung volumes. Moderate layering right pleural effusion. Bilateral lower lobe opacities, likely atelectasis. Superimposed airspace opacity in the right upper lobe is not excluded, but this appearance may simply reflect layering effusion/atelectasis in semirecumbent patient. No pneumothorax. Overall, this appearance is unchanged. Cardiomegaly. IMPRESSION: Moderate layering right pleural effusion. Bilateral lower lobe opacities, likely atelectasis. Superimposed right upper lobe pneumonia is possible but is not favored. Overall appearance is unchanged. Electronically Signed   By: Charline Bills M.D.   On: 08/12/2020 06:09    Assessment/Plan Active Problems:   Acute on chronic respiratory failure with hypoxia (HCC)   Chronic atrial fibrillation (HCC)   Metabolic encephalopathy   NASH (nonalcoholic steatohepatitis)   1. Acute on chronic respiratory failure hypoxia we will continue with a slow wean on T collar right now 20% FiO2 she is actually been liberated from the ventilator and the ventilator has been removed 2. Chronic atrial fibrillation rate is controlled we will continue with supportive care. 3. Metabolic encephalopathy no change continue present management 4. [name] supportive care   I have personally seen and evaluated the patient, evaluated laboratory and  imaging results, formulated the assessment and plan and placed orders. The Patient requires high complexity decision making with multiple systems involvement.  Rounds were done with the Respiratory Therapy Director and Staff therapists and discussed with nursing staff also.  Yevonne Pax, MD [facility] Pulmonary Critical Care  Medicine Sleep Medicine

## 2020-08-13 NOTE — Progress Notes (Signed)
IR consulted by Dr. Sharyon Medicus for possible image-guided percutaneous gastrostomy tube placement.  Case/images have been reviewed by IR MD. Patient with large-volume ascites per CT abdomen 08/10/2020- per IR protocol this is a contraindication for IR percutaneous gastrostomy tube placement. Recommend surgical consult for gastrostomy tube placement. No plans for IR procedures at this time- will delete order. Dr. Manson Passey made aware.  IR available in future if needed.   Waylan Boga Kentavius Dettore, PA-C 08/13/2020, 3:24 PM

## 2020-08-14 ENCOUNTER — Other Ambulatory Visit (HOSPITAL_COMMUNITY): Payer: Medicare Other

## 2020-08-14 DIAGNOSIS — K7581 Nonalcoholic steatohepatitis (NASH): Secondary | ICD-10-CM | POA: Diagnosis not present

## 2020-08-14 DIAGNOSIS — J9621 Acute and chronic respiratory failure with hypoxia: Secondary | ICD-10-CM | POA: Diagnosis not present

## 2020-08-14 DIAGNOSIS — G9341 Metabolic encephalopathy: Secondary | ICD-10-CM | POA: Diagnosis not present

## 2020-08-14 DIAGNOSIS — I482 Chronic atrial fibrillation, unspecified: Secondary | ICD-10-CM | POA: Diagnosis not present

## 2020-08-14 LAB — BASIC METABOLIC PANEL
Anion gap: 11 (ref 5–15)
BUN: 43 mg/dL — ABNORMAL HIGH (ref 8–23)
CO2: 33 mmol/L — ABNORMAL HIGH (ref 22–32)
Calcium: 8.3 mg/dL — ABNORMAL LOW (ref 8.9–10.3)
Chloride: 104 mmol/L (ref 98–111)
Creatinine, Ser: 0.68 mg/dL (ref 0.44–1.00)
GFR, Estimated: >60 mL/min (ref >60)
Glucose, Bld: 255 mg/dL — ABNORMAL HIGH (ref 70–99)
Potassium: 3.7 mmol/L (ref 3.5–5.1)
Sodium: 148 mmol/L — ABNORMAL HIGH (ref 135–145)

## 2020-08-14 LAB — CBC
HCT: 38.7 % (ref 36.0–46.0)
Hemoglobin: 10.6 g/dL — ABNORMAL LOW (ref 12.0–15.0)
MCH: 26.8 pg (ref 26.0–34.0)
MCHC: 27.4 g/dL — ABNORMAL LOW (ref 30.0–36.0)
MCV: 97.7 fL (ref 80.0–100.0)
Platelets: 126 10*3/uL — ABNORMAL LOW (ref 150–400)
RBC: 3.96 MIL/uL (ref 3.87–5.11)
RDW: 20.4 % — ABNORMAL HIGH (ref 11.5–15.5)
WBC: 15.5 10*3/uL — ABNORMAL HIGH (ref 4.0–10.5)
nRBC: 0.1 % (ref 0.0–0.2)

## 2020-08-14 NOTE — Progress Notes (Addendum)
Pulmonary Critical Care Medicine [facility] [place]   PULMONARY CRITICAL CARE SERVICE  PROGRESS NOTE  Date of Service: 08/14/2020  Emily Ho  GUR:[id number]  DOB: 1951-05-20   DOA: 07/28/2020  Referring Physician: [name], MD  HPI: Emily Ho is a 69 y.o. female seen for follow up of Acute on Chronic Respiratory Failure.  Patient remains on 28% aerosol trach collar satting well no fever distress.  Medications: Reviewed on Rounds  Physical Exam:  Vitals: Pulse 108 respirations 20 BP 155/80 O2 sat 95% temp 97.0  Ventilator Settings not currently on ventilator  . General: Comfortable at this time . Eyes: Grossly normal lids, irises & conjunctiva . ENT: grossly tongue is normal . Neck: no obvious mass . Cardiovascular: S1 S2 normal no gallop . Respiratory: No rales or rhonchi noted . Abdomen: soft . Skin: no rash seen on limited exam . Musculoskeletal: not rigid . Psychiatric:unable to assess . Neurologic: no seizure no involuntary movements         Lab Data:   Basic Metabolic Panel: Recent Labs  Lab 08/09/20 0454 08/11/20 0500 08/14/20 0436  NA 142 142 148*  K 4.9 4.3 3.7  CL 101 102 104  CO2 29 31 33*  GLUCOSE 232* 166* 255*  BUN 59* 54* 43*  CREATININE 0.86 0.76 0.68  CALCIUM 8.6* 8.1* 8.3*    ABG: Recent Labs  Lab 08/09/20 1815  PHART 7.447  PCO2ART 50.1*  PO2ART 92.2  HCO3 34.1*  O2SAT 97.2    Liver Function Tests: No results for input(s): AST, ALT, ALKPHOS, BILITOT, PROT, ALBUMIN in the last 168 hours. No results for input(s): LIPASE, AMYLASE in the last 168 hours. Recent Labs  Lab 08/09/20 0454 08/11/20 0500  AMMONIA 64* 47*    CBC: Recent Labs  Lab 08/09/20 0454  WBC 13.7*  HGB 9.7*  HCT 33.5*  MCV 93.1  PLT 194    Cardiac Enzymes: No results for input(s): CKTOTAL, CKMB, CKMBINDEX, TROPONINI in the last 168 hours.  BNP (last 3 results) No results for input(s): BNP in the last 8760 hours.  ProBNP  (last 3 results) No results for input(s): PROBNP in the last 8760 hours.  Radiological Exams: No results found.  Assessment/Plan Active Problems:   Acute on chronic respiratory failure with hypoxia (HCC)   Chronic atrial fibrillation (HCC)   Metabolic encephalopathy   NASH (nonalcoholic steatohepatitis)   1. Acute on chronic respiratory failure hypoxia patient continues to sat well on 28% aerosol trach collar we will continue supportive measures and pulmonary toilet. 2. Chronic atrial fibrillation rate is controlled we will continue with supportive care. 3. Metabolic encephalopathy no change continue present management 4. [name] supportive care   I have personally seen and evaluated the patient, evaluated laboratory and imaging results, formulated the assessment and plan and placed orders. The Patient requires high complexity decision making with multiple systems involvement.  Rounds were done with the Respiratory Therapy Director and Staff therapists and discussed with nursing staff also.  Yevonne Pax, MD [facility] Pulmonary Critical Care Medicine Sleep Medicine

## 2020-08-15 ENCOUNTER — Other Ambulatory Visit (HOSPITAL_COMMUNITY): Payer: Medicare Other

## 2020-08-15 DIAGNOSIS — J9621 Acute and chronic respiratory failure with hypoxia: Secondary | ICD-10-CM | POA: Diagnosis not present

## 2020-08-15 DIAGNOSIS — K7581 Nonalcoholic steatohepatitis (NASH): Secondary | ICD-10-CM | POA: Diagnosis not present

## 2020-08-15 DIAGNOSIS — I482 Chronic atrial fibrillation, unspecified: Secondary | ICD-10-CM | POA: Diagnosis not present

## 2020-08-15 DIAGNOSIS — G9341 Metabolic encephalopathy: Secondary | ICD-10-CM | POA: Diagnosis not present

## 2020-08-15 NOTE — Progress Notes (Addendum)
Pulmonary Critical Care Medicine [facility] [place]   PULMONARY CRITICAL CARE SERVICE  PROGRESS NOTE  Date of Service: 08/15/2020  Emily Ho  ZOX:[id number]  DOB: December 02, 1950   DOA: 08/21/2020  Referring Physician: [name], MD  HPI: Emily Ho is a 69 y.o. female seen for follow up of Acute on Chronic Respiratory Failure.  Patient remains on aerosol trach collar satting well no distress.  Medications: Reviewed on Rounds  Physical Exam:  Vitals: Pulse 120 respirations 26 BP 154/94 O2 sat 97% temp 96.4  Ventilator Settings not currently on ventilator  . General: Comfortable at this time . Eyes: Grossly normal lids, irises & conjunctiva . ENT: grossly tongue is normal . Neck: no obvious mass . Cardiovascular: S1 S2 normal no gallop . Respiratory: No rales or rhonchi noted . Abdomen: soft . Skin: no rash seen on limited exam . Musculoskeletal: not rigid . Psychiatric:unable to assess . Neurologic: no seizure no involuntary movements         Lab Data:   Basic Metabolic Panel: Recent Labs  Lab 08/09/20 0454 08/11/20 0500 08/14/20 0436  NA 142 142 148*  K 4.9 4.3 3.7  CL 101 102 104  CO2 29 31 33*  GLUCOSE 232* 166* 255*  BUN 59* 54* 43*  CREATININE 0.86 0.76 0.68  CALCIUM 8.6* 8.1* 8.3*    ABG: Recent Labs  Lab 08/09/20 1815  PHART 7.447  PCO2ART 50.1*  PO2ART 92.2  HCO3 34.1*  O2SAT 97.2    Liver Function Tests: No results for input(s): AST, ALT, ALKPHOS, BILITOT, PROT, ALBUMIN in the last 168 hours. No results for input(s): LIPASE, AMYLASE in the last 168 hours. Recent Labs  Lab 08/09/20 0454 08/11/20 0500  AMMONIA 64* 47*    CBC: Recent Labs  Lab 08/09/20 0454 08/14/20 1032  WBC 13.7* 15.5*  HGB 9.7* 10.6*  HCT 33.5* 38.7  MCV 93.1 97.7  PLT 194 126*    Cardiac Enzymes: No results for input(s): CKTOTAL, CKMB, CKMBINDEX, TROPONINI in the last 168 hours.  BNP (last 3 results) No results for input(s): BNP in  the last 8760 hours.  ProBNP (last 3 results) No results for input(s): PROBNP in the last 8760 hours.  Radiological Exams: DG Chest Port 1 View  Result Date: 08/15/2020 CLINICAL DATA:  Respiratory failure.  Pleural effusion. EXAM: PORTABLE CHEST 1 VIEW COMPARISON:  08/14/2020 FINDINGS: Tracheostomy and enteric tubes are unchanged in position. Shallow inspiration. Right greater than left pleural effusions. Atelectasis or infiltration in the right lung and left base. Similar appearance to previous study, lying for differences in technique. IMPRESSION: No significant interval change. Bilateral pleural effusions with atelectasis or infiltration in the right lung and left base. Electronically Signed   By: Burman Nieves M.D.   On: 08/15/2020 06:47   DG CHEST PORT 1 VIEW  Result Date: 08/14/2020 CLINICAL DATA:  RIGHT pleural effusion, follow-up abnormal chest radiograph EXAM: PORTABLE CHEST 1 VIEW COMPARISON:  Portable exam 1129 hours compared to 08/12/2020 FINDINGS: Tracheostomy tube and feeding tube unchanged. Stable heart size. Support devices and EKG leads project over chest. LEFT basilar atelectasis. Persistent RIGHT pleural effusion with atelectasis versus consolidation in RIGHT lung. Apices clear without pneumothorax. IMPRESSION: RIGHT basilar atelectasis. RIGHT pleural effusion with atelectasis versus consolidation of the mid RIGHT lower lung. Electronically Signed   By: Ulyses Southward M.D.   On: 08/14/2020 11:46    Assessment/Plan Active Problems:   Acute on chronic respiratory failure with hypoxia (HCC)   Chronic  atrial fibrillation (HCC)   Metabolic encephalopathy   NASH (nonalcoholic steatohepatitis)   1. Acute on chronic respiratory failure hypoxia  patient continues to do well on 20% aerosol trach collar continue to monitor. 2. Chronic atrial fibrillation rate is controlled we will continue with supportive care. 3. Metabolic encephalopathy no change continue present  management 4. [name] supportive care   I have personally seen and evaluated the patient, evaluated laboratory and imaging results, formulated the assessment and plan and placed orders. The Patient requires high complexity decision making with multiple systems involvement.  Rounds were done with the Respiratory Therapy Director and Staff therapists and discussed with nursing staff also.  Yevonne Pax, MD [facility] Pulmonary Critical Care Medicine Sleep Medicine

## 2020-08-16 ENCOUNTER — Other Ambulatory Visit (HOSPITAL_COMMUNITY): Payer: Medicare Other

## 2020-08-16 DIAGNOSIS — K7581 Nonalcoholic steatohepatitis (NASH): Secondary | ICD-10-CM | POA: Diagnosis not present

## 2020-08-16 DIAGNOSIS — G9341 Metabolic encephalopathy: Secondary | ICD-10-CM | POA: Diagnosis not present

## 2020-08-16 DIAGNOSIS — I482 Chronic atrial fibrillation, unspecified: Secondary | ICD-10-CM | POA: Diagnosis not present

## 2020-08-16 DIAGNOSIS — J9621 Acute and chronic respiratory failure with hypoxia: Secondary | ICD-10-CM | POA: Diagnosis not present

## 2020-08-16 HISTORY — PX: IR THORACENTESIS ASP PLEURAL SPACE W/IMG GUIDE: IMG5380

## 2020-08-16 LAB — BASIC METABOLIC PANEL
Anion gap: 9 (ref 5–15)
BUN: 47 mg/dL — ABNORMAL HIGH (ref 8–23)
CO2: 33 mmol/L — ABNORMAL HIGH (ref 22–32)
Calcium: 8.6 mg/dL — ABNORMAL LOW (ref 8.9–10.3)
Chloride: 106 mmol/L (ref 98–111)
Creatinine, Ser: 0.78 mg/dL (ref 0.44–1.00)
GFR, Estimated: >60 mL/min (ref >60)
Glucose, Bld: 241 mg/dL — ABNORMAL HIGH (ref 70–99)
Potassium: 4 mmol/L (ref 3.5–5.1)
Sodium: 148 mmol/L — ABNORMAL HIGH (ref 135–145)

## 2020-08-16 LAB — CULTURE, RESPIRATORY W GRAM STAIN: Culture: NORMAL

## 2020-08-16 LAB — CBC
HCT: 40.1 % (ref 36.0–46.0)
Hemoglobin: 11.2 g/dL — ABNORMAL LOW (ref 12.0–15.0)
MCH: 27.7 pg (ref 26.0–34.0)
MCHC: 27.9 g/dL — ABNORMAL LOW (ref 30.0–36.0)
MCV: 99.3 fL (ref 80.0–100.0)
Platelets: 134 10*3/uL — ABNORMAL LOW (ref 150–400)
RBC: 4.04 MIL/uL (ref 3.87–5.11)
RDW: 20.6 % — ABNORMAL HIGH (ref 11.5–15.5)
WBC: 17.8 10*3/uL — ABNORMAL HIGH (ref 4.0–10.5)
nRBC: 0.3 % — ABNORMAL HIGH (ref 0.0–0.2)

## 2020-08-16 LAB — AMMONIA: Ammonia: 37 umol/L — ABNORMAL HIGH (ref 9–35)

## 2020-08-16 MED ORDER — LIDOCAINE HCL 1 % IJ SOLN
INTRAMUSCULAR | Status: AC
  Filled 2020-08-16: qty 20

## 2020-08-16 NOTE — Procedures (Signed)
PROCEDURE SUMMARY:  Successful US guided right thoracentesis. Yielded 2L of hazy yellow fluid. Pt tolerated procedure well. No immediate complications.  CXR ordered; no post-procedure pneumothorax identified  EBL < 2 mL  Mickie Kay NP 08/16/2020 2:08 PM

## 2020-08-16 NOTE — Progress Notes (Addendum)
Pulmonary Critical Care Medicine [facility] [place]   PULMONARY CRITICAL CARE SERVICE  PROGRESS NOTE  Date of Service: 08/16/2020  DORENE BRUNI  BZJ:[id number]  DOB: 07-06-1951   DOA: 08/18/2020  Referring Physician: [name], MD  HPI: CAMREE WIGINGTON is a 69 y.o. female seen for follow up of Acute on Chronic Respiratory Failure.  Patient at this time is on T collar has been on 20% FiO2 with good saturations.  Medications: Reviewed on Rounds  Physical Exam:  Vitals: Temperature is 98.0 pulse 112 respiratory rate 17 blood pressure is 117/71 saturations 96%  Ventilator Settings on T collar with an FiO2 28%  . General: Comfortable at this time . Eyes: Grossly normal lids, irises & conjunctiva . ENT: grossly tongue is normal . Neck: no obvious mass . Cardiovascular: S1 S2 normal no gallop . Respiratory: No rhonchi coarse breath sounds . Abdomen: soft . Skin: no rash seen on limited exam . Musculoskeletal: not rigid . Psychiatric:unable to assess . Neurologic: no seizure no involuntary movements         Lab Data:   Basic Metabolic Panel: Recent Labs  Lab 08/11/20 0500 08/14/20 0436 08/16/20 0329  NA 142 148* 148*  K 4.3 3.7 4.0  CL 102 104 106  CO2 31 33* 33*  GLUCOSE 166* 255* 241*  BUN 54* 43* 47*  CREATININE 0.76 0.68 0.78  CALCIUM 8.1* 8.3* 8.6*    ABG: Recent Labs  Lab 08/09/20 1815  PHART 7.447  PCO2ART 50.1*  PO2ART 92.2  HCO3 34.1*  O2SAT 97.2    Liver Function Tests: No results for input(s): AST, ALT, ALKPHOS, BILITOT, PROT, ALBUMIN in the last 168 hours. No results for input(s): LIPASE, AMYLASE in the last 168 hours. Recent Labs  Lab 08/11/20 0500 08/16/20 0329  AMMONIA 47* 37*    CBC: Recent Labs  Lab 08/14/20 1032 08/16/20 0329  WBC 15.5* 17.8*  HGB 10.6* 11.2*  HCT 38.7 40.1  MCV 97.7 99.3  PLT 126* 134*    Cardiac Enzymes: No results for input(s): CKTOTAL, CKMB, CKMBINDEX, TROPONINI in the last 168  hours.  BNP (last 3 results) No results for input(s): BNP in the last 8760 hours.  ProBNP (last 3 results) No results for input(s): PROBNP in the last 8760 hours.  Radiological Exams: DG Chest Port 1 View  Result Date: 08/15/2020 CLINICAL DATA:  Respiratory failure.  Pleural effusion. EXAM: PORTABLE CHEST 1 VIEW COMPARISON:  08/14/2020 FINDINGS: Tracheostomy and enteric tubes are unchanged in position. Shallow inspiration. Right greater than left pleural effusions. Atelectasis or infiltration in the right lung and left base. Similar appearance to previous study, lying for differences in technique. IMPRESSION: No significant interval change. Bilateral pleural effusions with atelectasis or infiltration in the right lung and left base. Electronically Signed   By: Burman Nieves M.D.   On: 08/15/2020 06:47   DG CHEST PORT 1 VIEW  Result Date: 08/14/2020 CLINICAL DATA:  RIGHT pleural effusion, follow-up abnormal chest radiograph EXAM: PORTABLE CHEST 1 VIEW COMPARISON:  Portable exam 1129 hours compared to 08/12/2020 FINDINGS: Tracheostomy tube and feeding tube unchanged. Stable heart size. Support devices and EKG leads project over chest. LEFT basilar atelectasis. Persistent RIGHT pleural effusion with atelectasis versus consolidation in RIGHT lung. Apices clear without pneumothorax. IMPRESSION: RIGHT basilar atelectasis. RIGHT pleural effusion with atelectasis versus consolidation of the mid RIGHT lower lung. Electronically Signed   By: Ulyses Southward M.D.   On: 08/14/2020 11:46    Assessment/Plan Active Problems:  Acute on chronic respiratory failure with hypoxia (HCC)   Chronic atrial fibrillation (HCC)   Metabolic encephalopathy   NASH (nonalcoholic steatohepatitis)   1. Acute on chronic respiratory failure hypoxia we will continue with T collar trials currently is on 20% FiO2 good saturations noted. 2. Chronic atrial fibrillation rate is controlled at this time 3. Metabolic  encephalopathy grossly unchanged 4. [name] supportive care   I have personally seen and evaluated the patient, evaluated laboratory and imaging results, formulated the assessment and plan and placed orders. The Patient requires high complexity decision making with multiple systems involvement.  Rounds were done with the Respiratory Therapy Director and Staff therapists and discussed with nursing staff also.  Yevonne Pax, MD [facility] Pulmonary Critical Care Medicine Sleep Medicine

## 2020-08-17 DIAGNOSIS — K7581 Nonalcoholic steatohepatitis (NASH): Secondary | ICD-10-CM | POA: Diagnosis not present

## 2020-08-17 DIAGNOSIS — J9621 Acute and chronic respiratory failure with hypoxia: Secondary | ICD-10-CM | POA: Diagnosis not present

## 2020-08-17 DIAGNOSIS — I482 Chronic atrial fibrillation, unspecified: Secondary | ICD-10-CM | POA: Diagnosis not present

## 2020-08-17 DIAGNOSIS — G9341 Metabolic encephalopathy: Secondary | ICD-10-CM | POA: Diagnosis not present

## 2020-08-17 NOTE — Progress Notes (Addendum)
Pulmonary Critical Care Medicine [facility] [place]   PULMONARY CRITICAL CARE SERVICE  PROGRESS NOTE  Date of Service: 08/17/2020  Emily Ho  MEQ:[id number]  DOB: 12-Mar-1951   DOA: 08/02/2020  Referring Physician: [name], MD  HPI: Emily Ho is a 69 y.o. female seen for follow up of Acute on Chronic Respiratory Failure.  Patient mains on 28% aerosol trach collar which is her baseline.    Medications: Reviewed on Rounds  Physical Exam:  Vitals: Pulse 2017-05-27 respirations 10 BP 154/89 O2 sat 94% temp 98.5  Ventilator Settings not currently on ventilator  . General: Comfortable at this time . Eyes: Grossly normal lids, irises & conjunctiva . ENT: grossly tongue is normal . Neck: no obvious mass . Cardiovascular: S1 S2 normal no gallop . Respiratory: No rales or rhonchi noted . Abdomen: soft . Skin: no rash seen on limited exam . Musculoskeletal: not rigid . Psychiatric:unable to assess . Neurologic: no seizure no involuntary movements         Lab Data:   Basic Metabolic Panel: Recent Labs  Lab 08/11/20 0500 08/14/20 0436 08/16/20 0329  NA 142 148* 148*  K 4.3 3.7 4.0  CL 102 104 106  CO2 31 33* 33*  GLUCOSE 166* 255* 241*  BUN 54* 43* 47*  CREATININE 0.76 0.68 0.78  CALCIUM 8.1* 8.3* 8.6*    ABG: No results for input(s): PHART, PCO2ART, PO2ART, HCO3, O2SAT in the last 168 hours.  Liver Function Tests: No results for input(s): AST, ALT, ALKPHOS, BILITOT, PROT, ALBUMIN in the last 168 hours. No results for input(s): LIPASE, AMYLASE in the last 168 hours. Recent Labs  Lab 08/11/20 0500 08/16/20 0329  AMMONIA 47* 37*    CBC: Recent Labs  Lab 08/14/20 1032 08/16/20 0329  WBC 15.5* 17.8*  HGB 10.6* 11.2*  HCT 38.7 40.1  MCV 97.7 99.3  PLT 126* 134*    Cardiac Enzymes: No results for input(s): CKTOTAL, CKMB, CKMBINDEX, TROPONINI in the last 168 hours.  BNP (last 3 results) No results for input(s): BNP in the last 8760  hours.  ProBNP (last 3 results) No results for input(s): PROBNP in the last 8760 hours.  Radiological Exams: DG Chest Port 1 View  Result Date: 08/16/2020 CLINICAL DATA:  Post thoracentesis EXAM: PORTABLE CHEST 1 VIEW COMPARISON:  08/15/2020 FINDINGS: Tracheostomy device is present. Enteric tube passes into the stomach with tip out of field of view. Low lung volumes. Persistent right pleural effusion post thoracentesis. No pneumothorax. Bibasilar atelectasis. IMPRESSION: Persistent right pleural effusion post thoracentesis. No pneumothorax. Study was performed supine. Electronically Signed   By: Guadlupe Spanish M.D.   On: 08/16/2020 13:47   IR THORACENTESIS ASP PLEURAL SPACE W/IMG GUIDE  Result Date: 08/16/2020 INDICATION: Patient with acute on chronic respiratory failure and large pleural effusions. Interventional radiology asked to perform a therapeutic thoracentesis. EXAM: ULTRASOUND GUIDED THORACENTESIS MEDICATIONS: 1% lidocaine 10 mL COMPLICATIONS: None immediate. PROCEDURE: An ultrasound guided thoracentesis was thoroughly discussed with the patient and questions answered. The benefits, risks, alternatives and complications were also discussed. The patient understands and wishes to proceed with the procedure. Written consent was obtained. Ultrasound was performed to localize and mark an adequate pocket of fluid in the right chest. The area was then prepped and draped in the normal sterile fashion. 1% Lidocaine was used for local anesthesia. Under ultrasound guidance a 6 Fr Safe-T-Centesis catheter was introduced. Thoracentesis was performed. The catheter was removed and a dressing applied. FINDINGS: A total of  approximately 2 L of cloudy yellow fluid was removed. IMPRESSION: Successful ultrasound guided right thoracentesis yielding 2 L of pleural fluid. Read by: Alwyn Ren, NP Electronically Signed   By: Gilmer Mor D.O.   On: 08/16/2020 14:06    Assessment/Plan Active Problems:    Acute on chronic respiratory failure with hypoxia (HCC)   Chronic atrial fibrillation (HCC)   Metabolic encephalopathy   NASH (nonalcoholic steatohepatitis)   1. Acute on chronic respiratory failure hypoxia patient remains on 20% aerosol trach collar satting well at this time continue supportive measures pulmonary toilet. 2. Chronic atrial fibrillation rate is controlled at this time 3. Metabolic encephalopathy grossly unchanged 4. [name] supportive care   I have personally seen and evaluated the patient, evaluated laboratory and imaging results, formulated the assessment and plan and placed orders. The Patient requires high complexity decision making with multiple systems involvement.  Rounds were done with the Respiratory Therapy Director and Staff therapists and discussed with nursing staff also.  Yevonne Pax, MD [facility] Pulmonary Critical Care Medicine Sleep Medicine

## 2020-08-18 DIAGNOSIS — G9341 Metabolic encephalopathy: Secondary | ICD-10-CM | POA: Diagnosis not present

## 2020-08-18 DIAGNOSIS — K7581 Nonalcoholic steatohepatitis (NASH): Secondary | ICD-10-CM | POA: Diagnosis not present

## 2020-08-18 DIAGNOSIS — J9621 Acute and chronic respiratory failure with hypoxia: Secondary | ICD-10-CM | POA: Diagnosis not present

## 2020-08-18 DIAGNOSIS — I482 Chronic atrial fibrillation, unspecified: Secondary | ICD-10-CM | POA: Diagnosis not present

## 2020-08-18 NOTE — Progress Notes (Addendum)
Pulmonary Critical Care Medicine [facility] [place]   PULMONARY CRITICAL CARE SERVICE  PROGRESS NOTE  Date of Service: 08/18/2020  Emily Ho  [id number]:[id number]  DOB: November 12, 1950   DOA: 07/27/2020  Referring Physician: [name], MD  HPI: Emily Ho is a 69 y.o. female seen for follow up of Acute on Chronic Respiratory Failure.  Patient at this time is on T collar has been on 35% FiO2 good saturations are noted secretions are moderate  Medications: Reviewed on Rounds  Physical Exam:  Vitals: Temperature 98.1 pulse 81 respiratory rate is 16 blood pressure is 157/81 saturations 99%  Ventilator Settings on T collar FiO2 35%  . General: Comfortable at this time . Eyes: Grossly normal lids, irises & conjunctiva . ENT: grossly tongue is normal . Neck: no obvious mass . Cardiovascular: S1 S2 normal no gallop . Respiratory: Scattered rhonchi coarse breath sounds . Abdomen: soft . Skin: no rash seen on limited exam . Musculoskeletal: not rigid . Psychiatric:unable to assess . Neurologic: no seizure no involuntary movements         Lab Data:   Basic Metabolic Panel: Recent Labs  Lab 08/14/20 0436 08/16/20 0329  NA 148* 148*  K 3.7 4.0  CL 104 106  CO2 33* 33*  GLUCOSE 255* 241*  BUN 43* 47*  CREATININE 0.68 0.78  CALCIUM 8.3* 8.6*    ABG: No results for input(s): PHART, PCO2ART, PO2ART, HCO3, O2SAT in the last 168 hours.  Liver Function Tests: No results for input(s): AST, ALT, ALKPHOS, BILITOT, PROT, ALBUMIN in the last 168 hours. No results for input(s): LIPASE, AMYLASE in the last 168 hours. Recent Labs  Lab 08/16/20 0329  AMMONIA 37*    CBC: Recent Labs  Lab 08/14/20 1032 08/16/20 0329  WBC 15.5* 17.8*  HGB 10.6* 11.2*  HCT 38.7 40.1  MCV 97.7 99.3  PLT 126* 134*    Cardiac Enzymes: No results for input(s): CKTOTAL, CKMB, CKMBINDEX, TROPONINI in the last 168 hours.  BNP (last 3 results) No results for input(s): BNP in the last  8760 hours.  ProBNP (last 3 results) No results for input(s): PROBNP in the last 8760 hours.  Radiological Exams: DG Chest Port 1 View  Result Date: 08/16/2020 CLINICAL DATA:  Post thoracentesis EXAM: PORTABLE CHEST 1 VIEW COMPARISON:  08/15/2020 FINDINGS: Tracheostomy device is present. Enteric tube passes into the stomach with tip out of field of view. Low lung volumes. Persistent right pleural effusion post thoracentesis. No pneumothorax. Bibasilar atelectasis. IMPRESSION: Persistent right pleural effusion post thoracentesis. No pneumothorax. Study was performed supine. Electronically Signed   By: Guadlupe Spanish M.D.   On: 08/16/2020 13:47   IR THORACENTESIS ASP PLEURAL SPACE W/IMG GUIDE  Result Date: 08/16/2020 INDICATION: Patient with acute on chronic respiratory failure and large pleural effusions. Interventional radiology asked to perform a therapeutic thoracentesis. EXAM: ULTRASOUND GUIDED THORACENTESIS MEDICATIONS: 1% lidocaine 10 mL COMPLICATIONS: None immediate. PROCEDURE: An ultrasound guided thoracentesis was thoroughly discussed with the patient and questions answered. The benefits, risks, alternatives and complications were also discussed. The patient understands and wishes to proceed with the procedure. Written consent was obtained. Ultrasound was performed to localize and mark an adequate pocket of fluid in the right chest. The area was then prepped and draped in the normal sterile fashion. 1% Lidocaine was used for local anesthesia. Under ultrasound guidance a 6 Fr Safe-T-Centesis catheter was introduced. Thoracentesis was performed. The catheter was removed and a dressing applied. FINDINGS: A total of approximately 2  L of cloudy yellow fluid was removed. IMPRESSION: Successful ultrasound guided right thoracentesis yielding 2 L of pleural fluid. Read by: Alwyn Ren, NP Electronically Signed   By: Gilmer Mor D.O.   On: 08/16/2020 14:06    Assessment/Plan Active Problems:    Acute on chronic respiratory failure with hypoxia (HCC)   Chronic atrial fibrillation (HCC)   Metabolic encephalopathy   NASH (nonalcoholic steatohepatitis)   1. Acute on chronic respiratory failure with hypoxia we will continue with the T collar on 35% FiO2. 2. Chronic atrial fibrillation rate is controlled continue to follow 3. Metabolic encephalopathy no change 4. [name] supportive care   I have personally seen and evaluated the patient, evaluated laboratory and imaging results, formulated the assessment and plan and placed orders. The Patient requires high complexity decision making with multiple systems involvement.  Rounds were done with the Respiratory Therapy Director and Staff therapists and discussed with nursing staff also.  Yevonne Pax, MD [facility] Pulmonary Critical Care Medicine Sleep Medicine

## 2020-08-19 ENCOUNTER — Other Ambulatory Visit (HOSPITAL_COMMUNITY): Payer: Medicare Other

## 2020-08-19 DIAGNOSIS — I482 Chronic atrial fibrillation, unspecified: Secondary | ICD-10-CM | POA: Diagnosis not present

## 2020-08-19 DIAGNOSIS — G9341 Metabolic encephalopathy: Secondary | ICD-10-CM | POA: Diagnosis not present

## 2020-08-19 DIAGNOSIS — K7581 Nonalcoholic steatohepatitis (NASH): Secondary | ICD-10-CM | POA: Diagnosis not present

## 2020-08-19 DIAGNOSIS — J9621 Acute and chronic respiratory failure with hypoxia: Secondary | ICD-10-CM | POA: Diagnosis not present

## 2020-08-19 DIAGNOSIS — J90 Pleural effusion, not elsewhere classified: Secondary | ICD-10-CM

## 2020-08-19 LAB — BASIC METABOLIC PANEL
Anion gap: 9 (ref 5–15)
BUN: 57 mg/dL — ABNORMAL HIGH (ref 8–23)
CO2: 34 mmol/L — ABNORMAL HIGH (ref 22–32)
Calcium: 8.4 mg/dL — ABNORMAL LOW (ref 8.9–10.3)
Chloride: 105 mmol/L (ref 98–111)
Creatinine, Ser: 1.01 mg/dL — ABNORMAL HIGH (ref 0.44–1.00)
GFR, Estimated: 60 mL/min — ABNORMAL LOW (ref >60)
Glucose, Bld: 330 mg/dL — ABNORMAL HIGH (ref 70–99)
Potassium: 4.9 mmol/L (ref 3.5–5.1)
Sodium: 148 mmol/L — ABNORMAL HIGH (ref 135–145)

## 2020-08-19 LAB — URINALYSIS, ROUTINE W REFLEX MICROSCOPIC
Bilirubin Urine: NEGATIVE
Glucose, UA: NEGATIVE mg/dL
Hgb urine dipstick: NEGATIVE
Ketones, ur: NEGATIVE mg/dL
Nitrite: NEGATIVE
Protein, ur: 100 mg/dL — AB
Specific Gravity, Urine: 1.018 (ref 1.005–1.030)
pH: 5 (ref 5.0–8.0)

## 2020-08-19 LAB — BLOOD GAS, ARTERIAL
Acid-Base Excess: 10.1 mmol/L — ABNORMAL HIGH (ref 0.0–2.0)
Bicarbonate: 36.2 mmol/L — ABNORMAL HIGH (ref 20.0–28.0)
FIO2: 100
O2 Saturation: 99.9 %
Patient temperature: 36.6
pCO2 arterial: 69.9 mmHg (ref 32.0–48.0)
pH, Arterial: 7.332 — ABNORMAL LOW (ref 7.350–7.450)
pO2, Arterial: 303 mmHg — ABNORMAL HIGH (ref 83.0–108.0)

## 2020-08-19 LAB — CBC
HCT: 36.4 % (ref 36.0–46.0)
Hemoglobin: 10.1 g/dL — ABNORMAL LOW (ref 12.0–15.0)
MCH: 27.4 pg (ref 26.0–34.0)
MCHC: 27.7 g/dL — ABNORMAL LOW (ref 30.0–36.0)
MCV: 98.9 fL (ref 80.0–100.0)
Platelets: 116 10*3/uL — ABNORMAL LOW (ref 150–400)
RBC: 3.68 MIL/uL — ABNORMAL LOW (ref 3.87–5.11)
RDW: 20.8 % — ABNORMAL HIGH (ref 11.5–15.5)
WBC: 16 10*3/uL — ABNORMAL HIGH (ref 4.0–10.5)
nRBC: 0.4 % — ABNORMAL HIGH (ref 0.0–0.2)

## 2020-08-19 NOTE — Progress Notes (Signed)
Pulmonary Critical Care Medicine [facility] [place]   PULMONARY CRITICAL CARE SERVICE  PROGRESS NOTE  Date of Service: 08/19/2020  Emily Ho  TIW:[id number]  DOB: Mar 09, 1951   DOA: 08/29/2020  Referring Physician: [name], MD  HPI: Emily Ho is a 69 y.o. female seen for follow up of Acute on Chronic Respiratory Failure.  Patient was apparently placed back on the ventilator right now is on assist control mode because of increased work of breathing noted.  Currently saturations are excellent with patient was noted to have increased PCO2  Medications: Reviewed on Rounds  Physical Exam:  Vitals: Temperature 97.9 pulse 107 respiratory rate 30 blood pressure is 139/71 saturations 95%  Ventilator Settings on assist control FiO2 is 50% tidal volume 469 PEEP 5   General: Comfortable at this time  Eyes: Grossly normal lids, irises & conjunctiva  ENT: grossly tongue is normal  Neck: no obvious mass  Cardiovascular: S1 S2 normal no gallop  Respiratory: Coarse rhonchi expansion is equal at this time  Abdomen: soft  Skin: no rash seen on limited exam  Musculoskeletal: not rigid  Psychiatric:unable to assess  Neurologic: no seizure no involuntary movements         Lab Data:   Basic Metabolic Panel: Recent Labs  Lab 08/14/20 0436 08/16/20 0329 08/19/20 0557  NA 148* 148* 148*  K 3.7 4.0 4.9  CL 104 106 105  CO2 33* 33* 34*  GLUCOSE 255* 241* 330*  BUN 43* 47* 57*  CREATININE 0.68 0.78 1.01*  CALCIUM 8.3* 8.6* 8.4*    ABG: Recent Labs  Lab 08/19/20 0515  PHART 7.332*  PCO2ART 69.9*  PO2ART 303*  HCO3 36.2*  O2SAT 99.9    Liver Function Tests: No results for input(s): AST, ALT, ALKPHOS, BILITOT, PROT, ALBUMIN in the last 168 hours. No results for input(s): LIPASE, AMYLASE in the last 168 hours. Recent Labs  Lab 08/16/20 0329  AMMONIA 37*    CBC: Recent Labs  Lab 08/14/20 1032 08/16/20 0329  WBC 15.5* 17.8*  HGB 10.6*  11.2*  HCT 38.7 40.1  MCV 97.7 99.3  PLT 126* 134*    Cardiac Enzymes: No results for input(s): CKTOTAL, CKMB, CKMBINDEX, TROPONINI in the last 168 hours.  BNP (last 3 results) No results for input(s): BNP in the last 8760 hours.  ProBNP (last 3 results) No results for input(s): PROBNP in the last 8760 hours.  Radiological Exams: DG CHEST PORT 1 VIEW  Result Date: 08/19/2020 CLINICAL DATA:  Respiratory distress EXAM: PORTABLE CHEST 1 VIEW COMPARISON:  Chest x-rays dated 08/16/2020 and 08/15/2020. FINDINGS: Tracheostomy tube appears appropriately positioned in the midline. Enteric tube passes below the diaphragm. Heart size is grossly stable. Near complete opacification of the RIGHT hemithorax, largest component likely due to a large pleural effusion, presumably reaccumulated status post thoracentesis on 08/16/2020. Probable smaller pleural effusion and/or atelectasis at the LEFT lung base. No pneumothorax is seen. IMPRESSION: 1. Large RIGHT pleural effusion, presumably reaccumulated status post thoracentesis on 08/16/2020 2. Probable smaller pleural effusion and/or atelectasis at the LEFT lung base. Electronically Signed   By: Bary Richard M.D.   On: 08/19/2020 04:23    Assessment/Plan Active Problems:   Acute on chronic respiratory failure with hypoxia (HCC)   Chronic atrial fibrillation (HCC)   Metabolic encephalopathy   NASH (nonalcoholic steatohepatitis)   1. Acute on chronic respiratory failure hypoxia we will continue with full support on the ventilator for now have respiratory therapy recheck the ABG and  then try to wean FiO2 ABG as appropriate however chest x-ray still showing a large right-sided pleural effusion may need another thoracentesis. 2. Chronic atrial fibrillation rate is controlled we will continue with supportive care. 3. Pleural effusion patient may need a follow-up thoracentesis to be done on the right side to drain the pleural effusion.  She is already had 2 L  drained suspect this fluid may be coming from ascites that might be present would consider evaluation for ascites. 4. Metabolic encephalopathy no change we will continue with supportive care 5. [name] supportive care   I have personally seen and evaluated the patient, evaluated laboratory and imaging results, formulated the assessment and plan and placed orders. The Patient requires high complexity decision making with multiple systems involvement.  Rounds were done with the Respiratory Therapy Director and Staff therapists and discussed with nursing staff also.  Time 35 minutes  Yevonne Pax, MD [facility] Pulmonary Critical Care Medicine Sleep Medicine

## 2020-08-20 ENCOUNTER — Other Ambulatory Visit (HOSPITAL_COMMUNITY): Payer: Medicare Other

## 2020-08-20 DIAGNOSIS — I482 Chronic atrial fibrillation, unspecified: Secondary | ICD-10-CM | POA: Diagnosis not present

## 2020-08-20 DIAGNOSIS — K7581 Nonalcoholic steatohepatitis (NASH): Secondary | ICD-10-CM | POA: Diagnosis not present

## 2020-08-20 DIAGNOSIS — J9621 Acute and chronic respiratory failure with hypoxia: Secondary | ICD-10-CM | POA: Diagnosis not present

## 2020-08-20 DIAGNOSIS — G9341 Metabolic encephalopathy: Secondary | ICD-10-CM | POA: Diagnosis not present

## 2020-08-20 NOTE — Progress Notes (Signed)
Pulmonary Critical Care Medicine [facility] [place]   PULMONARY CRITICAL CARE SERVICE  PROGRESS NOTE  Date of Service: 08/20/2020  JAYLEN CLAUDE  IDP:[id number]  DOB: 27-Aug-1951   DOA: 08/23/2020  Referring Physician: [name], MD  HPI: Emily Ho is a 69 y.o. female seen for follow up of Acute on Chronic Respiratory Failure.  Patient currently is on pressure support mode has been on 30% FiO2 currently on a pressure support of 12/5 patient should be ready for T-bar  Medications: Reviewed on Rounds  Physical Exam:  Vitals: Temperature 98.2 pulse 110 respiratory rate 16 blood pressure is 150/96 saturations 97%  Ventilator Settings on pressure support FiO2 30% pressure 12/5  . General: Comfortable at this time . Eyes: Grossly normal lids, irises & conjunctiva . ENT: grossly tongue is normal . Neck: no obvious mass . Cardiovascular: S1 S2 normal no gallop . Respiratory: No rhonchi no rales noted at this time . Abdomen: soft . Skin: no rash seen on limited exam . Musculoskeletal: not rigid . Psychiatric:unable to assess . Neurologic: no seizure no involuntary movements         Lab Data:   Basic Metabolic Panel: Recent Labs  Lab 08/14/20 0436 08/16/20 0329 08/19/20 0557  NA 148* 148* 148*  K 3.7 4.0 4.9  CL 104 106 105  CO2 33* 33* 34*  GLUCOSE 255* 241* 330*  BUN 43* 47* 57*  CREATININE 0.68 0.78 1.01*  CALCIUM 8.3* 8.6* 8.4*    ABG: Recent Labs  Lab 08/19/20 0515  PHART 7.332*  PCO2ART 69.9*  PO2ART 303*  HCO3 36.2*  O2SAT 99.9    Liver Function Tests: No results for input(s): AST, ALT, ALKPHOS, BILITOT, PROT, ALBUMIN in the last 168 hours. No results for input(s): LIPASE, AMYLASE in the last 168 hours. Recent Labs  Lab 08/16/20 0329  AMMONIA 37*    CBC: Recent Labs  Lab 08/14/20 1032 08/16/20 0329 08/19/20 1718  WBC 15.5* 17.8* 16.0*  HGB 10.6* 11.2* 10.1*  HCT 38.7 40.1 36.4  MCV 97.7 99.3 98.9  PLT 126* 134* 116*     Cardiac Enzymes: No results for input(s): CKTOTAL, CKMB, CKMBINDEX, TROPONINI in the last 168 hours.  BNP (last 3 results) No results for input(s): BNP in the last 8760 hours.  ProBNP (last 3 results) No results for input(s): PROBNP in the last 8760 hours.  Radiological Exams: DG CHEST PORT 1 VIEW  Result Date: 08/19/2020 CLINICAL DATA:  Respiratory distress EXAM: PORTABLE CHEST 1 VIEW COMPARISON:  Chest x-rays dated 08/16/2020 and 08/15/2020. FINDINGS: Tracheostomy tube appears appropriately positioned in the midline. Enteric tube passes below the diaphragm. Heart size is grossly stable. Near complete opacification of the RIGHT hemithorax, largest component likely due to a large pleural effusion, presumably reaccumulated status post thoracentesis on 08/16/2020. Probable smaller pleural effusion and/or atelectasis at the LEFT lung base. No pneumothorax is seen. IMPRESSION: 1. Large RIGHT pleural effusion, presumably reaccumulated status post thoracentesis on 08/16/2020 2. Probable smaller pleural effusion and/or atelectasis at the LEFT lung base. Electronically Signed   By: Bary Richard M.D.   On: 08/19/2020 04:23    Assessment/Plan Active Problems:   Acute on chronic respiratory failure with hypoxia (HCC)   Chronic atrial fibrillation (HCC)   Metabolic encephalopathy   NASH (nonalcoholic steatohepatitis)   1. Acute on chronic respiratory failure with hypoxia plan is going to be try to advance the weaning on T collar as tolerated. 2. Chronic atrial fibrillation rate is controlled 3. Metabolic  encephalopathy no change we will continue to follow 4. Nash supportive care 5. Pleural effusion consult radiology for possible thoracentesis   I have personally seen and evaluated the patient, evaluated laboratory and imaging results, formulated the assessment and plan and placed orders. The Patient requires high complexity decision making with multiple systems involvement.  Rounds were  done with the Respiratory Therapy Director and Staff therapists and discussed with nursing staff also.  Yevonne Pax, MD [facility] Pulmonary Critical Care Medicine Sleep Medicine

## 2020-08-21 ENCOUNTER — Other Ambulatory Visit (HOSPITAL_COMMUNITY): Payer: Medicare Other

## 2020-08-21 DIAGNOSIS — K7581 Nonalcoholic steatohepatitis (NASH): Secondary | ICD-10-CM | POA: Diagnosis not present

## 2020-08-21 DIAGNOSIS — G9341 Metabolic encephalopathy: Secondary | ICD-10-CM | POA: Diagnosis not present

## 2020-08-21 DIAGNOSIS — I482 Chronic atrial fibrillation, unspecified: Secondary | ICD-10-CM | POA: Diagnosis not present

## 2020-08-21 DIAGNOSIS — J9621 Acute and chronic respiratory failure with hypoxia: Secondary | ICD-10-CM | POA: Diagnosis not present

## 2020-08-21 HISTORY — PX: IR THORACENTESIS ASP PLEURAL SPACE W/IMG GUIDE: IMG5380

## 2020-08-21 LAB — CULTURE, RESPIRATORY W GRAM STAIN: Culture: NORMAL

## 2020-08-21 LAB — URINE CULTURE: Culture: 80000 — AB

## 2020-08-21 MED ORDER — LIDOCAINE HCL 1 % IJ SOLN
INTRAMUSCULAR | Status: AC
  Filled 2020-08-21: qty 20

## 2020-08-21 MED ORDER — LIDOCAINE HCL 1 % IJ SOLN
INTRAMUSCULAR | Status: DC | PRN
  Administered 2020-08-21: 20 mL via INTRADERMAL

## 2020-08-21 NOTE — Procedures (Signed)
PROCEDURE SUMMARY:  Successful US guided right thoracentesis. Yielded 2.2 L of cloudy yellow fluid. Pt tolerated procedure well. No immediate complications.  CXR ordered; no post-procedure pneumothorax identified  EBL < 2 mL  Mickie Kay, NP 08/21/2020 3:09 PM

## 2020-08-21 NOTE — Progress Notes (Signed)
Pulmonary Critical Care Medicine [facility] [place]   PULMONARY CRITICAL CARE SERVICE  PROGRESS NOTE  Date of Service: 08/21/2020  Emily Ho  INO:[id number]  DOB: 1951/06/23   DOA: 08/21/2020  Referring Physician: [name], MD  HPI: Emily Ho is a 69 y.o. female seen for follow up of Acute on Chronic Respiratory Failure.  At this time patient is on assist control mode has been on 30% FiO2 good saturations are noted.  Not weaning  Medications: Reviewed on Rounds  Physical Exam:  Vitals: Temperature is 96.9 pulse 102 respiratory 24 blood pressure is 149/78 saturations 95%  Ventilator Settings on assist control FiO2 of 30%  . General: Comfortable at this time . Eyes: Grossly normal lids, irises & conjunctiva . ENT: grossly tongue is normal . Neck: no obvious mass . Cardiovascular: S1 S2 normal no gallop . Respiratory: No rhonchi very coarse breath sounds . Abdomen: soft . Skin: no rash seen on limited exam . Musculoskeletal: not rigid . Psychiatric:unable to assess . Neurologic: no seizure no involuntary movements         Lab Data:   Basic Metabolic Panel: Recent Labs  Lab 08/16/20 0329 08/19/20 0557  NA 148* 148*  K 4.0 4.9  CL 106 105  CO2 33* 34*  GLUCOSE 241* 330*  BUN 47* 57*  CREATININE 0.78 1.01*  CALCIUM 8.6* 8.4*    ABG: Recent Labs  Lab 08/19/20 0515  PHART 7.332*  PCO2ART 69.9*  PO2ART 303*  HCO3 36.2*  O2SAT 99.9    Liver Function Tests: No results for input(s): AST, ALT, ALKPHOS, BILITOT, PROT, ALBUMIN in the last 168 hours. No results for input(s): LIPASE, AMYLASE in the last 168 hours. Recent Labs  Lab 08/16/20 0329  AMMONIA 37*    CBC: Recent Labs  Lab 08/16/20 0329 08/19/20 1718  WBC 17.8* 16.0*  HGB 11.2* 10.1*  HCT 40.1 36.4  MCV 99.3 98.9  PLT 134* 116*    Cardiac Enzymes: No results for input(s): CKTOTAL, CKMB, CKMBINDEX, TROPONINI in the last 168 hours.  BNP (last 3 results) No results  for input(s): BNP in the last 8760 hours.  ProBNP (last 3 results) No results for input(s): PROBNP in the last 8760 hours.  Radiological Exams: No results found.  Assessment/Plan Active Problems:   Acute on chronic respiratory failure with hypoxia (HCC)   Chronic atrial fibrillation (HCC)   Metabolic encephalopathy   NASH (nonalcoholic steatohepatitis)   1. Acute on chronic respiratory failure with hypoxia we will continue with full support on the ventilator respiratory therapy will assess the RSB and try to wean. 2. Chronic atrial fibrillation rate is controlled we will continue to follow 3. Metabolic encephalopathy no change supportive care 4. Pleural effusion status post thoracentesis 5. [name] supportive care   I have personally seen and evaluated the patient, evaluated laboratory and imaging results, formulated the assessment and plan and placed orders. The Patient requires high complexity decision making with multiple systems involvement.  Rounds were done with the Respiratory Therapy Director and Staff therapists and discussed with nursing staff also.  Yevonne Pax, MD [facility] Pulmonary Critical Care Medicine Sleep Medicine

## 2020-08-22 DIAGNOSIS — J9621 Acute and chronic respiratory failure with hypoxia: Secondary | ICD-10-CM | POA: Diagnosis not present

## 2020-08-22 DIAGNOSIS — K7581 Nonalcoholic steatohepatitis (NASH): Secondary | ICD-10-CM | POA: Diagnosis not present

## 2020-08-22 DIAGNOSIS — I482 Chronic atrial fibrillation, unspecified: Secondary | ICD-10-CM | POA: Diagnosis not present

## 2020-08-22 DIAGNOSIS — G9341 Metabolic encephalopathy: Secondary | ICD-10-CM | POA: Diagnosis not present

## 2020-08-22 LAB — CBC
HCT: 38.4 % (ref 36.0–46.0)
Hemoglobin: 10.9 g/dL — ABNORMAL LOW (ref 12.0–15.0)
MCH: 27.7 pg (ref 26.0–34.0)
MCHC: 28.4 g/dL — ABNORMAL LOW (ref 30.0–36.0)
MCV: 97.7 fL (ref 80.0–100.0)
Platelets: 131 10*3/uL — ABNORMAL LOW (ref 150–400)
RBC: 3.93 MIL/uL (ref 3.87–5.11)
RDW: 21.8 % — ABNORMAL HIGH (ref 11.5–15.5)
WBC: 15 10*3/uL — ABNORMAL HIGH (ref 4.0–10.5)
nRBC: 0.7 % — ABNORMAL HIGH (ref 0.0–0.2)

## 2020-08-22 LAB — BASIC METABOLIC PANEL
Anion gap: 10 (ref 5–15)
BUN: 90 mg/dL — ABNORMAL HIGH (ref 8–23)
CO2: 33 mmol/L — ABNORMAL HIGH (ref 22–32)
Calcium: 8.4 mg/dL — ABNORMAL LOW (ref 8.9–10.3)
Chloride: 107 mmol/L (ref 98–111)
Creatinine, Ser: 1.35 mg/dL — ABNORMAL HIGH (ref 0.44–1.00)
GFR, Estimated: 42 mL/min — ABNORMAL LOW (ref >60)
Glucose, Bld: 127 mg/dL — ABNORMAL HIGH (ref 70–99)
Potassium: 4.1 mmol/L (ref 3.5–5.1)
Sodium: 150 mmol/L — ABNORMAL HIGH (ref 135–145)

## 2020-08-22 NOTE — Progress Notes (Signed)
Pulmonary Critical Care Medicine [facility] [place]   PULMONARY CRITICAL CARE SERVICE  PROGRESS NOTE  Date of Service: 08/22/2020  Emily Ho  WER:[id number]  DOB: 25-Feb-1951   DOA: 08/18/2020  Referring Physician: [name], MD  HPI: Emily Ho is a 69 y.o. female seen for follow up of Acute on Chronic Respiratory Failure.  Patient currently is on T collar has been on 35% FiO2 with good saturations are noted.  Medications: Reviewed on Rounds  Physical Exam:  Vitals: Temperature is 98.1 pulse 60 respiratory 18 blood pressure is 115/50 saturations 96%  Ventilator Settings on T collar with an FiO2 35%  . General: Comfortable at this time . Eyes: Grossly normal lids, irises & conjunctiva . ENT: grossly tongue is normal . Neck: no obvious mass . Cardiovascular: S1 S2 normal no gallop . Respiratory: Scattered rhonchi with expansion equal . Abdomen: soft . Skin: no rash seen on limited exam . Musculoskeletal: not rigid . Psychiatric:unable to assess . Neurologic: no seizure no involuntary movements         Lab Data:   Basic Metabolic Panel: Recent Labs  Lab 08/16/20 0329 08/19/20 0557 08/22/20 0435  NA 148* 148* 150*  K 4.0 4.9 4.1  CL 106 105 107  CO2 33* 34* 33*  GLUCOSE 241* 330* 127*  BUN 47* 57* 90*  CREATININE 0.78 1.01* 1.35*  CALCIUM 8.6* 8.4* 8.4*    ABG: Recent Labs  Lab 08/19/20 0515  PHART 7.332*  PCO2ART 69.9*  PO2ART 303*  HCO3 36.2*  O2SAT 99.9    Liver Function Tests: No results for input(s): AST, ALT, ALKPHOS, BILITOT, PROT, ALBUMIN in the last 168 hours. No results for input(s): LIPASE, AMYLASE in the last 168 hours. Recent Labs  Lab 08/16/20 0329  AMMONIA 37*    CBC: Recent Labs  Lab 08/16/20 0329 08/19/20 1718 08/22/20 0435  WBC 17.8* 16.0* 15.0*  HGB 11.2* 10.1* 10.9*  HCT 40.1 36.4 38.4  MCV 99.3 98.9 97.7  PLT 134* 116* 131*    Cardiac Enzymes: No results for input(s): CKTOTAL, CKMB,  CKMBINDEX, TROPONINI in the last 168 hours.  BNP (last 3 results) No results for input(s): BNP in the last 8760 hours.  ProBNP (last 3 results) No results for input(s): PROBNP in the last 8760 hours.  Radiological Exams: DG Chest 1 View  Result Date: 08/21/2020 CLINICAL DATA:  Post thoracentesis EXAM: CHEST  1 VIEW COMPARISON:  08/19/2020 FINDINGS: The previously demonstrated right-sided pleural effusion has decreased in size since the prior study. There are persistent small to moderate-sized bilateral pleural effusions with adjacent airspace disease which is favored to represent compressive atelectasis. There is no pneumothorax. The enteric tube extends below the left hemidiaphragm. The tracheostomy tube is stable. The heart size is difficult to evaluate but appears grossly stable from prior study. IMPRESSION: Improving right-sided pleural effusion. No pneumothorax. Persistent small to moderate-sized bilateral pleural effusions with adjacent airspace disease, favored to represent compressive atelectasis. Electronically Signed   By: Katherine Mantle M.D.   On: 08/21/2020 15:02   IR THORACENTESIS ASP PLEURAL SPACE W/IMG GUIDE  Result Date: 08/21/2020 INDICATION: Patient with acute on chronic respiratory failure and large pleural effusions. Interventional radiology asked to perform a therapeutic Thoracentesis. EXAM: ULTRASOUND GUIDED THORACENTESIS MEDICATIONS: 1% lidocaine 10 mL COMPLICATIONS: None immediate. PROCEDURE: An ultrasound guided thoracentesis was thoroughly discussed with the patient and questions answered. The benefits, risks, alternatives and complications were also discussed. The patient understands and wishes to proceed with the  procedure. Written consent was obtained. Ultrasound was performed to localize and mark an adequate pocket of fluid in the right chest. The area was then prepped and draped in the normal sterile fashion. 1% Lidocaine was used for local anesthesia. Under  ultrasound guidance a 6 Fr Safe-T-Centesis catheter was introduced. Thoracentesis was performed. The catheter was removed and a dressing applied. FINDINGS: A total of approximately 2.2 L of cloudy yellow fluid was removed. IMPRESSION: Successful ultrasound guided right thoracentesis yielding 2.2 L of pleural fluid. Read by: Alwyn Ren, NP Electronically Signed   By: Irish Lack M.D.   On: 08/21/2020 15:08    Assessment/Plan Active Problems:   Acute on chronic respiratory failure with hypoxia (HCC)   Chronic atrial fibrillation (HCC)   Metabolic encephalopathy   NASH (nonalcoholic steatohepatitis)   1. Acute on chronic respiratory failure hypoxia we will continue to wean on the T collar as ordered. 2. Chronic atrial fibrillation rate is controlled 3. Metabolic encephalopathy no change supportive care 4. Nash at baseline 5. Pleural effusion status post thoracentesis 2.2 L of fluid removed   I have personally seen and evaluated the patient, evaluated laboratory and imaging results, formulated the assessment and plan and placed orders. The Patient requires high complexity decision making with multiple systems involvement.  Rounds were done with the Respiratory Therapy Director and Staff therapists and discussed with nursing staff also.  Yevonne Pax, MD [facility] Pulmonary Critical Care Medicine Sleep Medicine

## 2020-08-23 ENCOUNTER — Other Ambulatory Visit (HOSPITAL_COMMUNITY): Payer: Medicare Other

## 2020-08-23 DIAGNOSIS — K7581 Nonalcoholic steatohepatitis (NASH): Secondary | ICD-10-CM | POA: Diagnosis not present

## 2020-08-23 DIAGNOSIS — J9621 Acute and chronic respiratory failure with hypoxia: Secondary | ICD-10-CM | POA: Diagnosis not present

## 2020-08-23 DIAGNOSIS — I482 Chronic atrial fibrillation, unspecified: Secondary | ICD-10-CM | POA: Diagnosis not present

## 2020-08-23 DIAGNOSIS — G9341 Metabolic encephalopathy: Secondary | ICD-10-CM | POA: Diagnosis not present

## 2020-08-23 LAB — COMPREHENSIVE METABOLIC PANEL
ALT: 45 U/L — ABNORMAL HIGH (ref 0–44)
AST: 51 U/L — ABNORMAL HIGH (ref 15–41)
Albumin: 1.9 g/dL — ABNORMAL LOW (ref 3.5–5.0)
Alkaline Phosphatase: 215 U/L — ABNORMAL HIGH (ref 38–126)
Anion gap: 12 (ref 5–15)
BUN: 83 mg/dL — ABNORMAL HIGH (ref 8–23)
CO2: 29 mmol/L (ref 22–32)
Calcium: 8.1 mg/dL — ABNORMAL LOW (ref 8.9–10.3)
Chloride: 107 mmol/L (ref 98–111)
Creatinine, Ser: 1.37 mg/dL — ABNORMAL HIGH (ref 0.44–1.00)
GFR, Estimated: 41 mL/min — ABNORMAL LOW (ref >60)
Glucose, Bld: 221 mg/dL — ABNORMAL HIGH (ref 70–99)
Potassium: 4.7 mmol/L (ref 3.5–5.1)
Sodium: 148 mmol/L — ABNORMAL HIGH (ref 135–145)
Total Bilirubin: 1.3 mg/dL — ABNORMAL HIGH (ref 0.3–1.2)
Total Protein: 6.5 g/dL (ref 6.5–8.1)

## 2020-08-23 LAB — CBC
HCT: 44.9 % (ref 36.0–46.0)
Hemoglobin: 12.2 g/dL (ref 12.0–15.0)
MCH: 27.4 pg (ref 26.0–34.0)
MCHC: 27.2 g/dL — ABNORMAL LOW (ref 30.0–36.0)
MCV: 100.9 fL — ABNORMAL HIGH (ref 80.0–100.0)
Platelets: 176 10*3/uL (ref 150–400)
RBC: 4.45 MIL/uL (ref 3.87–5.11)
RDW: 21.8 % — ABNORMAL HIGH (ref 11.5–15.5)
WBC: 16.7 10*3/uL — ABNORMAL HIGH (ref 4.0–10.5)
nRBC: 0.7 % — ABNORMAL HIGH (ref 0.0–0.2)

## 2020-08-23 LAB — MAGNESIUM: Magnesium: 2.7 mg/dL — ABNORMAL HIGH (ref 1.7–2.4)

## 2020-08-23 LAB — LACTIC ACID, PLASMA: Lactic Acid, Venous: 4.3 mmol/L (ref 0.5–1.9)

## 2020-08-23 LAB — PHOSPHORUS: Phosphorus: 4.6 mg/dL (ref 2.5–4.6)

## 2020-08-23 NOTE — Progress Notes (Signed)
Pulmonary Critical Care Medicine [facility] [place]   PULMONARY CRITICAL CARE SERVICE  PROGRESS NOTE  Date of Service: 08/23/2020  Emily Ho  ERX:[id number]  DOB: 01-Mar-1951   DOA: 08/13/2020  Referring Physician: [name], MD  HPI: Emily Ho is a 69 y.o. female seen for follow up of Acute on Chronic Respiratory Failure.  Patient remains on T collar currently is on 20% FiO2 with good saturations  Medications: Reviewed on Rounds  Physical Exam:  Vitals: Temperature is 98.3 pulse 93 respiratory rate 20 blood pressure is 90/68 saturations 93%  Ventilator Settings on T collar with an FiO2 28%  . General: Comfortable at this time . Eyes: Grossly normal lids, irises & conjunctiva . ENT: grossly tongue is normal . Neck: no obvious mass . Cardiovascular: S1 S2 normal no gallop . Respiratory: Scattered rhonchi no rales . Abdomen: soft . Skin: no rash seen on limited exam . Musculoskeletal: not rigid . Psychiatric:unable to assess . Neurologic: no seizure no involuntary movements         Lab Data:   Basic Metabolic Panel: Recent Labs  Lab 08/19/20 0557 08/22/20 0435  NA 148* 150*  K 4.9 4.1  CL 105 107  CO2 34* 33*  GLUCOSE 330* 127*  BUN 57* 90*  CREATININE 1.01* 1.35*  CALCIUM 8.4* 8.4*    ABG: Recent Labs  Lab 08/19/20 0515  PHART 7.332*  PCO2ART 69.9*  PO2ART 303*  HCO3 36.2*  O2SAT 99.9    Liver Function Tests: No results for input(s): AST, ALT, ALKPHOS, BILITOT, PROT, ALBUMIN in the last 168 hours. No results for input(s): LIPASE, AMYLASE in the last 168 hours. No results for input(s): AMMONIA in the last 168 hours.  CBC: Recent Labs  Lab 08/19/20 1718 08/22/20 0435  WBC 16.0* 15.0*  HGB 10.1* 10.9*  HCT 36.4 38.4  MCV 98.9 97.7  PLT 116* 131*    Cardiac Enzymes: No results for input(s): CKTOTAL, CKMB, CKMBINDEX, TROPONINI in the last 168 hours.  BNP (last 3 results) No results for input(s): BNP in the last 8760  hours.  ProBNP (last 3 results) No results for input(s): PROBNP in the last 8760 hours.  Radiological Exams: DG Chest 1 View  Result Date: 08/21/2020 CLINICAL DATA:  Post thoracentesis EXAM: CHEST  1 VIEW COMPARISON:  08/19/2020 FINDINGS: The previously demonstrated right-sided pleural effusion has decreased in size since the prior study. There are persistent small to moderate-sized bilateral pleural effusions with adjacent airspace disease which is favored to represent compressive atelectasis. There is no pneumothorax. The enteric tube extends below the left hemidiaphragm. The tracheostomy tube is stable. The heart size is difficult to evaluate but appears grossly stable from prior study. IMPRESSION: Improving right-sided pleural effusion. No pneumothorax. Persistent small to moderate-sized bilateral pleural effusions with adjacent airspace disease, favored to represent compressive atelectasis. Electronically Signed   By: Katherine Mantle M.D.   On: 08/21/2020 15:02   IR THORACENTESIS ASP PLEURAL SPACE W/IMG GUIDE  Result Date: 08/21/2020 INDICATION: Patient with acute on chronic respiratory failure and large pleural effusions. Interventional radiology asked to perform a therapeutic Thoracentesis. EXAM: ULTRASOUND GUIDED THORACENTESIS MEDICATIONS: 1% lidocaine 10 mL COMPLICATIONS: None immediate. PROCEDURE: An ultrasound guided thoracentesis was thoroughly discussed with the patient and questions answered. The benefits, risks, alternatives and complications were also discussed. The patient understands and wishes to proceed with the procedure. Written consent was obtained. Ultrasound was performed to localize and mark an adequate pocket of fluid in the right chest.  The area was then prepped and draped in the normal sterile fashion. 1% Lidocaine was used for local anesthesia. Under ultrasound guidance a 6 Fr Safe-T-Centesis catheter was introduced. Thoracentesis was performed. The catheter was removed  and a dressing applied. FINDINGS: A total of approximately 2.2 L of cloudy yellow fluid was removed. IMPRESSION: Successful ultrasound guided right thoracentesis yielding 2.2 L of pleural fluid. Read by: Alwyn Ren, NP Electronically Signed   By: Irish Lack M.D.   On: 08/21/2020 15:08    Assessment/Plan Active Problems:   Acute on chronic respiratory failure with hypoxia (HCC)   Chronic atrial fibrillation (HCC)   Metabolic encephalopathy   NASH (nonalcoholic steatohepatitis)   1. Acute on chronic respiratory failure with hypoxia we will continue with on T collar trials patient is on 28% FiO2. 2. Chronic atrial fibrillation rate is controlled at this time continue to monitor 3. Metabolic encephalopathy no change supportive care. 4. Emily Ho supportive care patient is at baseline 5. Pleural effusion status post thoracentesis   I have personally seen and evaluated the patient, evaluated laboratory and imaging results, formulated the assessment and plan and placed orders. The Patient requires high complexity decision making with multiple systems involvement.  Rounds were done with the Respiratory Therapy Director and Staff therapists and discussed with nursing staff also.  Yevonne Pax, MD [facility] Pulmonary Critical Care Medicine Sleep Medicine

## 2020-08-24 DIAGNOSIS — K7581 Nonalcoholic steatohepatitis (NASH): Secondary | ICD-10-CM | POA: Diagnosis not present

## 2020-08-24 DIAGNOSIS — I482 Chronic atrial fibrillation, unspecified: Secondary | ICD-10-CM | POA: Diagnosis not present

## 2020-08-24 DIAGNOSIS — G9341 Metabolic encephalopathy: Secondary | ICD-10-CM | POA: Diagnosis not present

## 2020-08-24 DIAGNOSIS — J9621 Acute and chronic respiratory failure with hypoxia: Secondary | ICD-10-CM | POA: Diagnosis not present

## 2020-08-24 LAB — BASIC METABOLIC PANEL
Anion gap: 13 (ref 5–15)
BUN: 87 mg/dL — ABNORMAL HIGH (ref 8–23)
CO2: 32 mmol/L (ref 22–32)
Calcium: 8.3 mg/dL — ABNORMAL LOW (ref 8.9–10.3)
Chloride: 104 mmol/L (ref 98–111)
Creatinine, Ser: 1.5 mg/dL — ABNORMAL HIGH (ref 0.44–1.00)
GFR, Estimated: 37 mL/min — ABNORMAL LOW (ref >60)
Glucose, Bld: 256 mg/dL — ABNORMAL HIGH (ref 70–99)
Potassium: 4.4 mmol/L (ref 3.5–5.1)
Sodium: 149 mmol/L — ABNORMAL HIGH (ref 135–145)

## 2020-08-24 LAB — BLOOD GAS, ARTERIAL
Acid-Base Excess: 10.3 mmol/L — ABNORMAL HIGH (ref 0.0–2.0)
Bicarbonate: 33.3 mmol/L — ABNORMAL HIGH (ref 20.0–28.0)
FIO2: 100
O2 Saturation: 99.2 %
Patient temperature: 36.7
pCO2 arterial: 35.1 mmHg (ref 32.0–48.0)
pH, Arterial: 7.583 — ABNORMAL HIGH (ref 7.350–7.450)
pO2, Arterial: 123 mmHg — ABNORMAL HIGH (ref 83.0–108.0)

## 2020-08-24 NOTE — Progress Notes (Signed)
Pulmonary Critical Care Medicine [facility] [place]   PULMONARY CRITICAL CARE SERVICE  PROGRESS NOTE  Date of Service: 08/14/2020  Emily Ho  WUX:[id number]  DOB: 1951-01-11   DOA: 08/02/2020  Referring Physician: [name], MD  HPI: Emily Ho is a 69 y.o. female seen for follow up of Acute on Chronic Respiratory Failure. Patient currently is on the ventilator apparently coded overnight had to be placed back on the ventilator now is on assist control mode with an FiO2 of 60% tidal volume 450 and a PEEP of 5  Medications: Reviewed on Rounds  Physical Exam:  Vitals: Temperature 99.1 pulse 110 respiratory rate 28 blood pressure is 134/78 saturations 100%  Ventilator Settings on assist control FiO2 60% tidal volume of 450 with a PEEP of 5  . General: Comfortable at this time . Eyes: Grossly normal lids, irises & conjunctiva . ENT: grossly tongue is normal . Neck: no obvious mass . Cardiovascular: S1 S2 normal no gallop . Respiratory: No rhonchi no rales are noted at this time . Abdomen: soft . Skin: no rash seen on limited exam . Musculoskeletal: not rigid . Psychiatric:unable to assess . Neurologic: no seizure no involuntary movements         Lab Data:   Basic Metabolic Panel: Recent Labs  Lab 08/19/20 0557 08/22/20 0435 08/23/20 2010 07/27/2020 0453  NA 148* 150* 148* 149*  K 4.9 4.1 4.7 4.4  CL 105 107 107 104  CO2 34* 33* 29 32  GLUCOSE 330* 127* 221* 256*  BUN 57* 90* 83* 87*  CREATININE 1.01* 1.35* 1.37* 1.50*  CALCIUM 8.4* 8.4* 8.1* 8.3*  MG  --   --  2.7*  --   PHOS  --   --  4.6  --     ABG: Recent Labs  Lab 08/19/20 0515 08/15/2020 0535  PHART 7.332* 7.583*  PCO2ART 69.9* 35.1  PO2ART 303* 123*  HCO3 36.2* 33.3*  O2SAT 99.9 99.2    Liver Function Tests: Recent Labs  Lab 08/23/20 2010  AST 51*  ALT 45*  ALKPHOS 215*  BILITOT 1.3*  PROT 6.5  ALBUMIN 1.9*   No results for input(s): LIPASE, AMYLASE in the last 168  hours. No results for input(s): AMMONIA in the last 168 hours.  CBC: Recent Labs  Lab 08/19/20 1718 08/22/20 0435 08/23/20 2010  WBC 16.0* 15.0* 16.7*  HGB 10.1* 10.9* 12.2  HCT 36.4 38.4 44.9  MCV 98.9 97.7 100.9*  PLT 116* 131* 176    Cardiac Enzymes: No results for input(s): CKTOTAL, CKMB, CKMBINDEX, TROPONINI in the last 168 hours.  BNP (last 3 results) No results for input(s): BNP in the last 8760 hours.  ProBNP (last 3 results) No results for input(s): PROBNP in the last 8760 hours.  Radiological Exams: DG CHEST PORT 1 VIEW  Result Date: 08/23/2020 CLINICAL DATA:  Respiratory failure EXAM: PORTABLE CHEST 1 VIEW COMPARISON:  08/21/2020 FINDINGS: There is now complete opacification of the right hemithorax, new. Tracheostomy tube tip is at the level of the clavicular heads. Left lung is relatively clear. IMPRESSION: New complete opacification of the right hemithorax due to pleural effusion. The change may be due to supine imaging technique compared to semi erect positioning on the previous study. Electronically Signed   By: Deatra Robinson M.D.   On: 08/23/2020 20:43   DG Abd Portable 1V  Result Date: 08/23/2020 CLINICAL DATA:  Nasogastric tube placement EXAM: PORTABLE ABDOMEN - 1 VIEW COMPARISON:  July 19, 2020  FINDINGS: Nasogastric tube tip and side port in distal stomach. No bowel dilatation or air-fluid level appreciable on somewhat limited study. No free air evident. IMPRESSION: Nasogastric tube tip and side port in distal stomach. Visualized bowel gas pattern unremarkable. Electronically Signed   By: Bretta Bang III M.D.   On: 08/23/2020 14:19    Assessment/Plan Active Problems:   Acute on chronic respiratory failure with hypoxia (HCC)   Chronic atrial fibrillation (HCC)   Metabolic encephalopathy   NASH (nonalcoholic steatohepatitis)   1. Acute on chronic respiratory failure with hypoxia patient decompensated overnight placed back on the ventilator  however now also patient is DNR. 2. Chronic atrial fibrillation rate is controlled at this time we will continue to follow 3. Metabolic encephalopathy at baseline we will continue with supportive care 4. Emily Ho continue present management supportive care   I have personally seen and evaluated the patient, evaluated laboratory and imaging results, formulated the assessment and plan and placed orders. The Patient requires high complexity decision making with multiple systems involvement.  Rounds were done with the Respiratory Therapy Director and Staff therapists and discussed with nursing staff also.  Yevonne Pax, MD [facility] Pulmonary Critical Care Medicine Sleep Medicine

## 2020-08-27 DEATH — deceased

## 2020-08-30 MED FILL — Medication: Qty: 1 | Status: AC

## 2020-09-05 ENCOUNTER — Ambulatory Visit (INDEPENDENT_AMBULATORY_CARE_PROVIDER_SITE_OTHER): Payer: Self-pay | Admitting: Otolaryngology

## 2020-09-05 NOTE — H&P (Signed)
PREOPERATIVE H&P  Chief Complaint: Acute on chronic respiratory failure  HPI: Emily Ho is a 69 y.o. female who presents for evaluation of possible tracheostomy.  Patient was admitted to an outside hospital with respiratory problems requiring intubation.  [facility] intubated and on vent.  Weaning the patient off of the ventilator has been unsuccessful and tracheostomy was recommended.  She is taken to the operating room at this time for tracheostomy.  Past Medical History:  Diagnosis Date  . Acute on chronic respiratory failure with hypoxia (HCC)   . Chronic atrial fibrillation (HCC)   . Metabolic encephalopathy   . NASH (nonalcoholic steatohepatitis)     Social History   Socioeconomic History  . Marital status: Widowed    Spouse name: Not on file  . Number of children: Not on file  . Years of education: Not on file  . Highest education level: Not on file  Occupational History  . Not on file  Tobacco Use  . Smoking status: Not on file  Substance and Sexual Activity  . Alcohol use: Not on file  . Drug use: Not on file  . Sexual activity: Not on file  Other Topics Concern  . Not on file  Social History Narrative  . Not on file   Social Determinants of Health   Financial Resource Strain:   . Difficulty of Paying Living Expenses: Not on file  Food Insecurity:   . Worried About Programme researcher, broadcasting/film/video in the Last Year: Not on file  . Ran Out of Food in the Last Year: Not on file  Transportation Needs:   . Lack of Transportation (Medical): Not on file  . Lack of Transportation (Non-Medical): Not on file  Physical Activity:   . Days of Exercise per Week: Not on file  . Minutes of Exercise per Session: Not on file  Stress:   . Feeling of Stress : Not on file  Social Connections:   . Frequency of Communication with Friends and Family: Not on file  . Frequency of Social Gatherings with Friends and Family: Not  on file  . Attends Religious Services: Not on file  . Active Member of Clubs or Organizations: Not on file  . Attends Banker Meetings: Not on file  . Marital Status: Not on file   No family history on file. Allergies  Allergen Reactions  . Vancomycin Rash  . Penicillins Rash   Prior to Admission medications   Medication Sig Start Date End Date Taking? Authorizing Provider  amitriptyline (ELAVIL) 50 MG tablet Take 50 mg by mouth every evening. 05/21/20   [provider]  atorvastatin (LIPITOR) 10 MG tablet Take 10 mg by mouth daily. 04/08/20   [provider]  diltiazem (CARDIZEM CD) 300 MG 24 hr capsule Take 300 mg by mouth daily. 03/20/20   [provider]  furosemide (LASIX) 40 MG tablet Take 40 mg by mouth daily. 04/16/20   [provider]  LANTUS SOLOSTAR 100 UNIT/ML Solostar Pen Inject 46 Units into the skin at bedtime. 06/14/20   [provider]  omeprazole (PRILOSEC) 20 MG capsule Take 20 mg by mouth daily. 05/04/20   [provider]  simvastatin (ZOCOR) 20 MG tablet Take 20 mg by mouth at bedtime. 05/21/20   [provider]  TRULICITY 1.5 MG/0.5ML SOPN Inject 1.5 mg as directed every Monday.  06/08/20   [provider]     Positive ROS:  Otherwise negative  All other systems have been reviewed and were otherwise negative with the exception of those mentioned in the HPI and as above.  Physical Exam: There were no vitals filed for this visit.  General: Patient is intubated and on ventilator and not that responsive. Nasal: Clear nasal passages with NG tube in place. Neck: No palpable adenopathy or thyroid nodules.  Trachea is midline with no palpable masses. Cardiovascular: Regular rate and rhythm, no murmur.  Respiratory: Clear to auscultation.  Patient is intubated on ventilator   Assessment/Plan: Acute on chronic respiratory failure.  She has been intubated for over 2 weeks.  Plan for  tracheostomy.   Dillard Cannon, MD 09/05/2020 1:15 PM

## 2021-06-24 IMAGING — DX DG CHEST 1V PORT
1 series · 1 of 1 positions shown · non-contrast
Comparison: 07/30/2020

CLINICAL DATA: Fever

EXAM:
PORTABLE CHEST 1 VIEW

[chest]
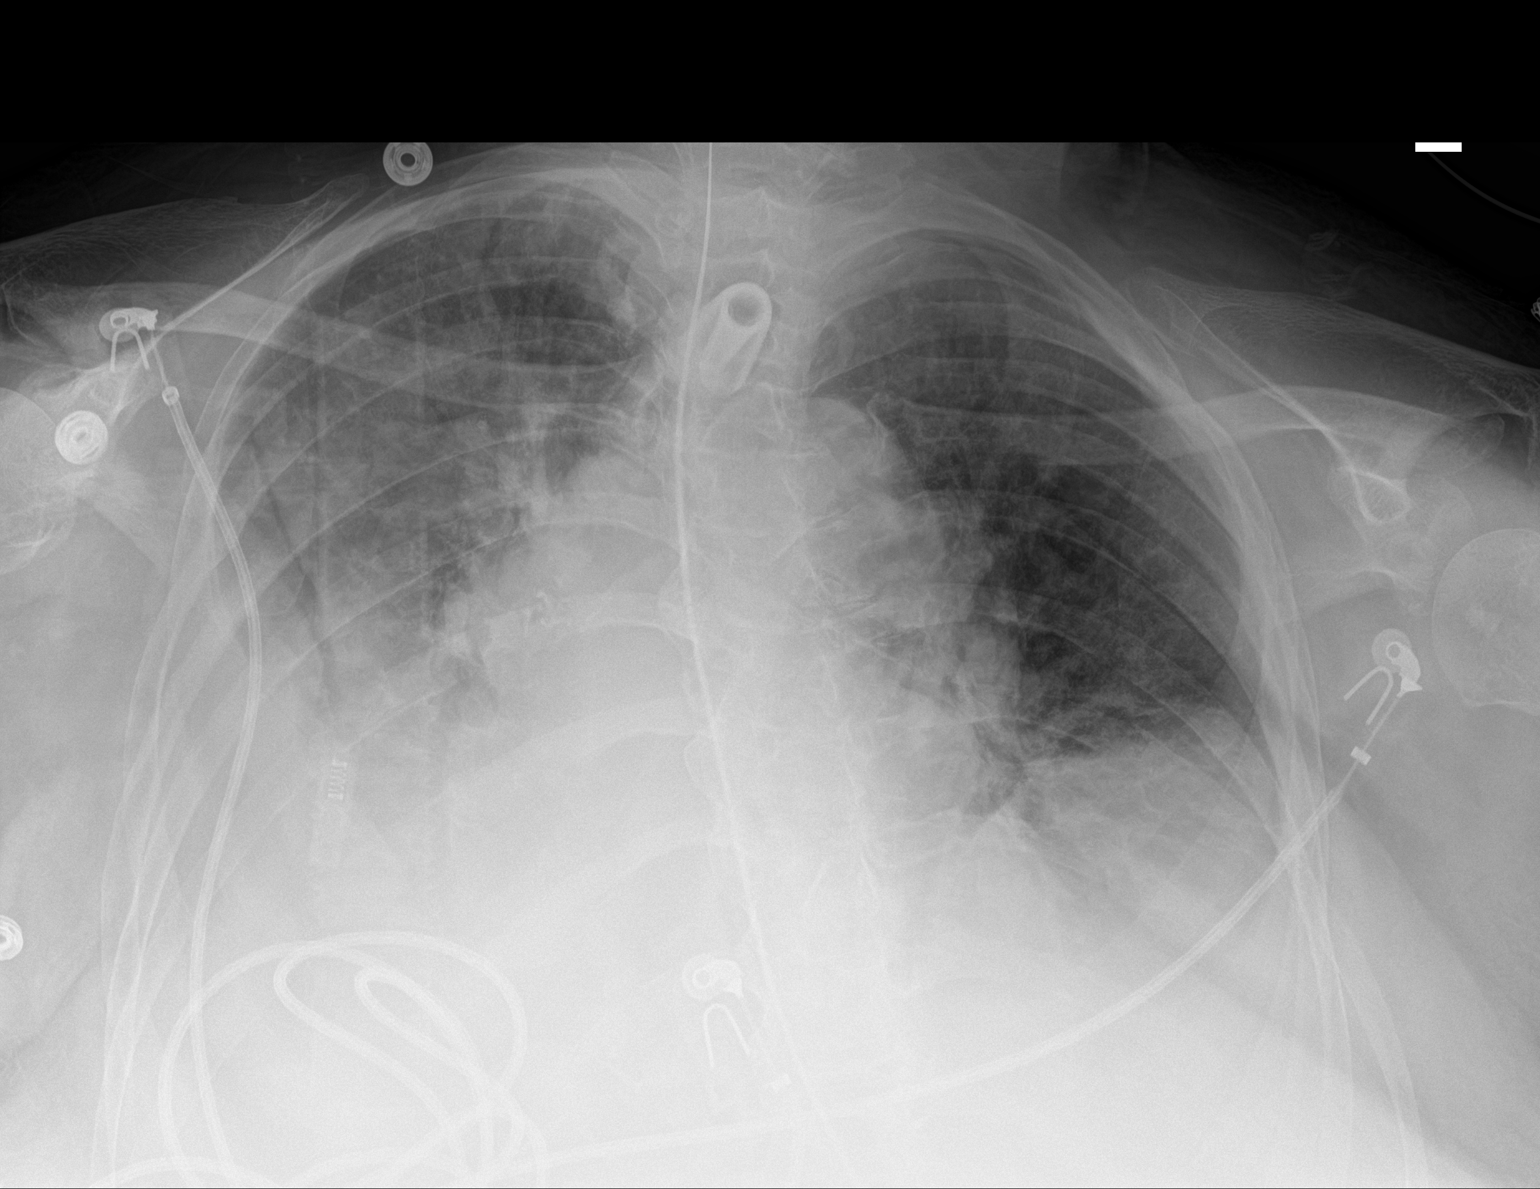

[1 of 1 positions shown; findings below may reference images not displayed]

FINDINGS: Tracheostomy. Patient rotated left. Normal heart size for level of
inspiration. Layering right pleural effusion, increased. No
pneumothorax. Right sided airspace disease is mid and lower lung
predominant. Minimal left base atelectasis. Removal of left PICC
line.
IMPRESSION: Significantly worsened right-sided aeration with increased layering
pleural effusion and lower lung predominant airspace disease.
Atelectasis or infection.

Cardiomegaly without overt congestive failure. Minimal left base
atelectasis.

## 2021-06-27 IMAGING — DX DG CHEST 1V PORT
1 series · 1 of 1 positions shown · non-contrast
Comparison: 08/09/2020

CLINICAL DATA: Pleural effusion

EXAM:
PORTABLE CHEST 1 VIEW

[chest ap]
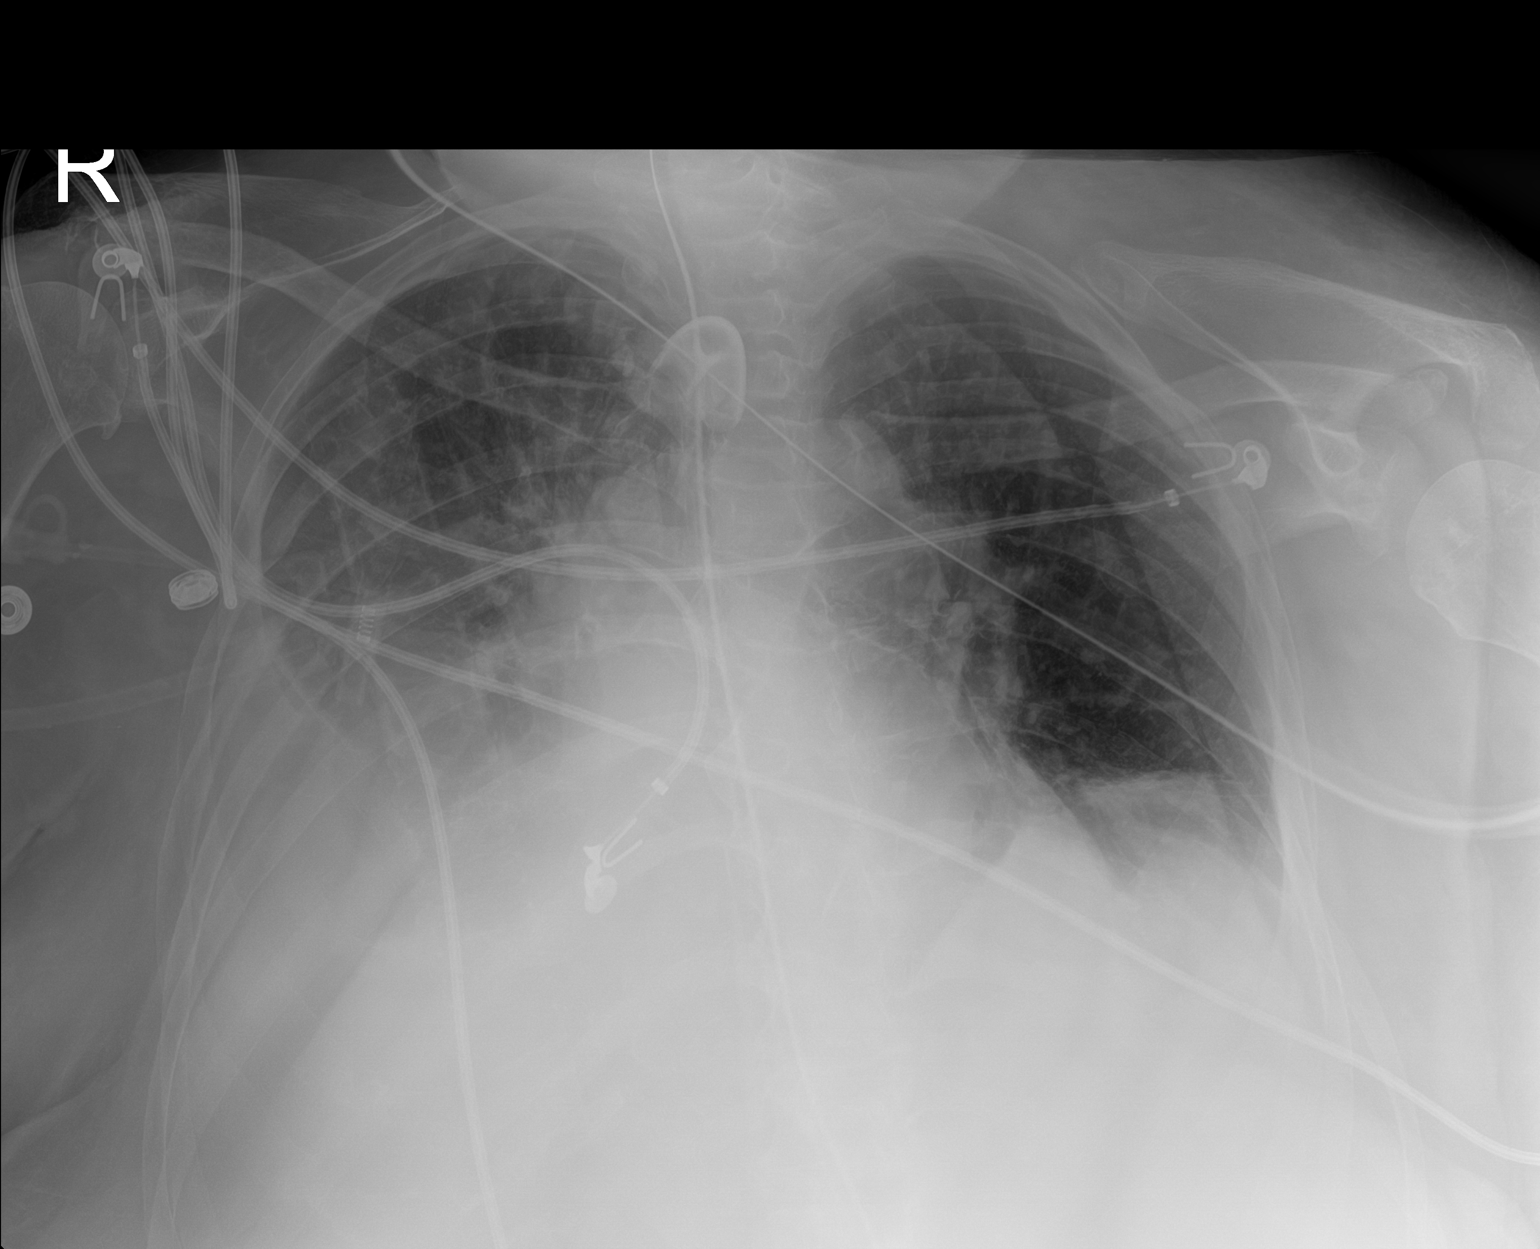

[1 of 1 positions shown; findings below may reference images not displayed]

FINDINGS: Tracheostomy in satisfactory position.

Low lung volumes. Moderate layering right pleural effusion.
Bilateral lower lobe opacities, likely atelectasis.

Superimposed airspace opacity in the right upper lobe is not
excluded, but this appearance may simply reflect layering
effusion/atelectasis in semirecumbent patient.

No pneumothorax.

Overall, this appearance is unchanged.

Cardiomegaly.
IMPRESSION: Moderate layering right pleural effusion.

Bilateral lower lobe opacities, likely atelectasis. Superimposed
right upper lobe pneumonia is possible but is not favored.

Overall appearance is unchanged.

## 2021-06-29 IMAGING — DX DG CHEST 1V PORT
1 series · 1 of 1 positions shown · non-contrast
Comparison: Portable exam 4445 hours compared to 08/12/2020

CLINICAL DATA: RIGHT pleural effusion, follow-up abnormal chest
radiograph

EXAM:
PORTABLE CHEST 1 VIEW

[chest]
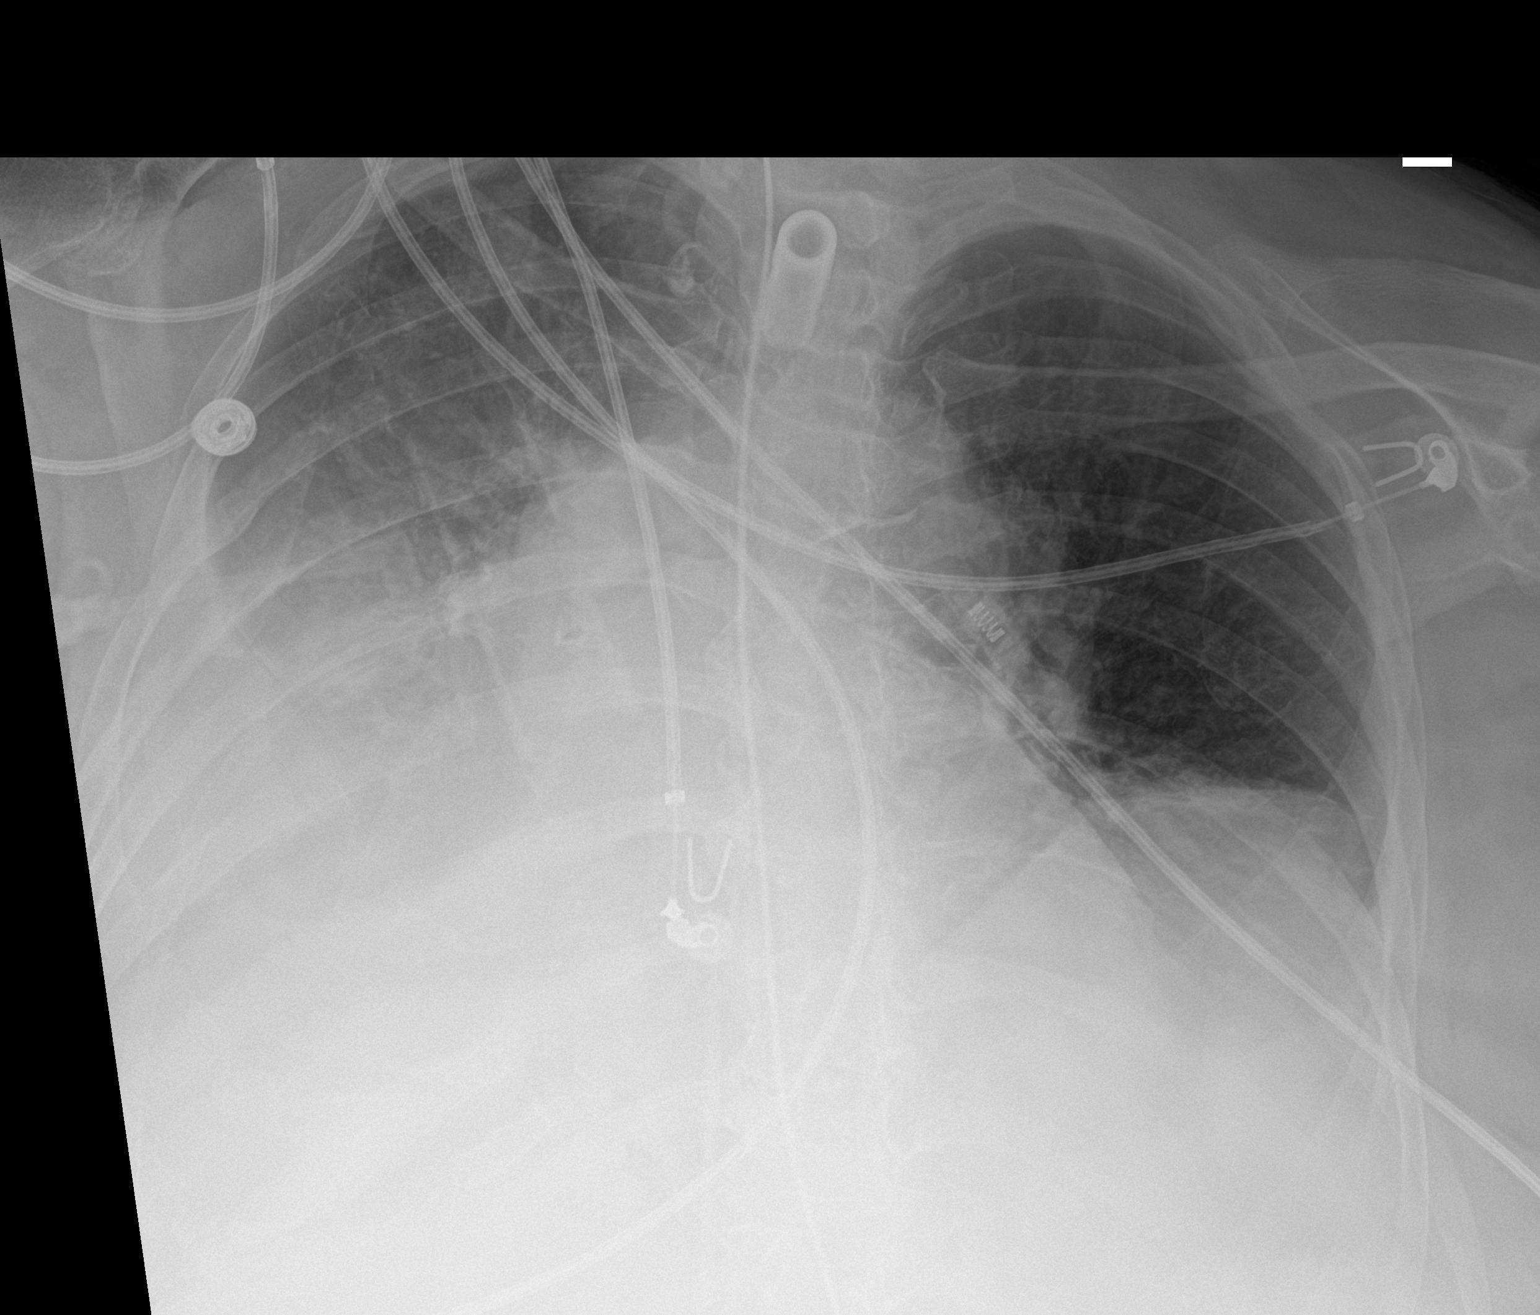

[1 of 1 positions shown; findings below may reference images not displayed]

FINDINGS: Tracheostomy tube and feeding tube unchanged.

Stable heart size.

Support devices and EKG leads project over chest.

LEFT basilar atelectasis.

Persistent RIGHT pleural effusion with atelectasis versus
consolidation in RIGHT lung.

Apices clear without pneumothorax.
IMPRESSION: RIGHT basilar atelectasis.

RIGHT pleural effusion with atelectasis versus consolidation of the
mid RIGHT lower lung.

## 2021-07-01 IMAGING — DX DG CHEST 1V PORT
1 series · 1 of 1 positions shown · non-contrast
Comparison: 08/15/2020

CLINICAL DATA: Post thoracentesis

EXAM:
PORTABLE CHEST 1 VIEW

[chest]
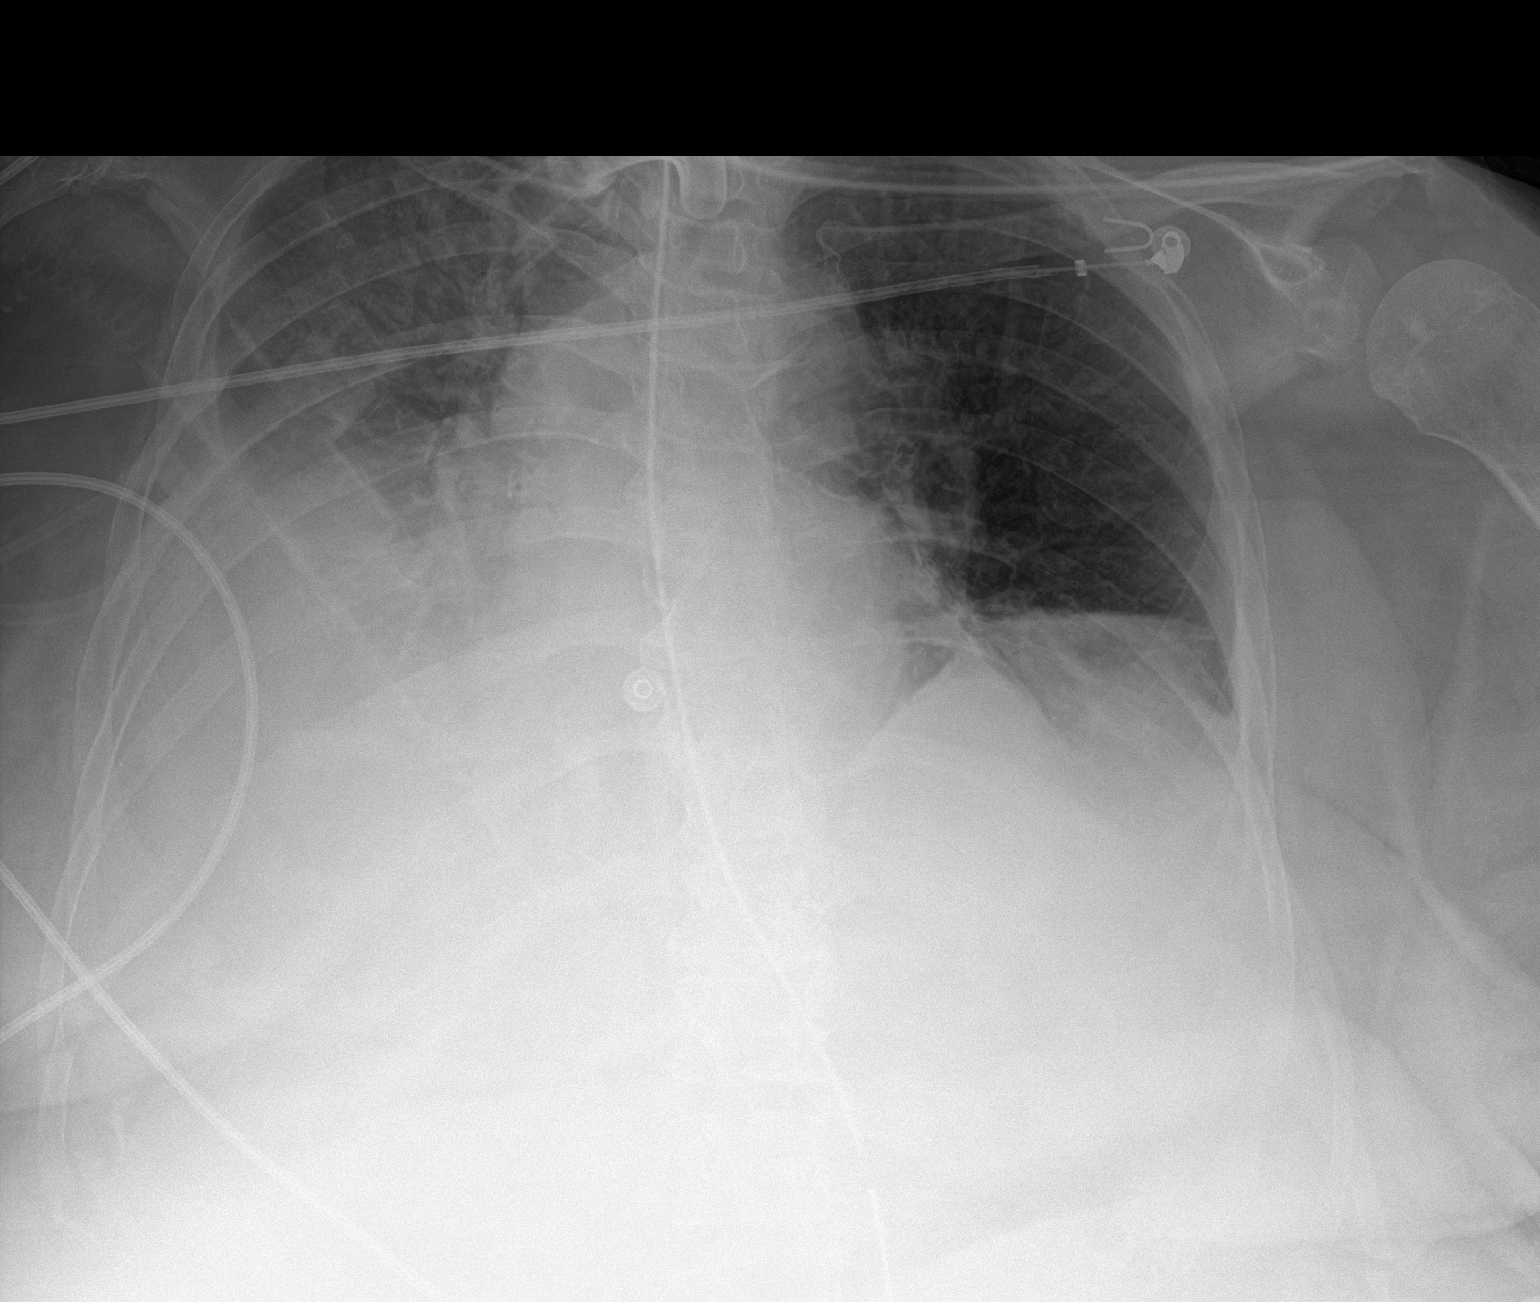

[1 of 1 positions shown; findings below may reference images not displayed]

FINDINGS: Tracheostomy device is present. Enteric tube passes into the stomach
with tip out of field of view.

Low lung volumes. Persistent right pleural effusion post
thoracentesis. No pneumothorax. Bibasilar atelectasis.
IMPRESSION: Persistent right pleural effusion post thoracentesis. No
pneumothorax. Study was performed supine.

## 2021-07-04 IMAGING — DX DG CHEST 1V PORT
1 series · 1 of 1 positions shown · non-contrast
Comparison: Chest x-rays dated 08/16/2020 and 08/15/2020.

CLINICAL DATA: Respiratory distress

EXAM:
PORTABLE CHEST 1 VIEW

[chest ap]
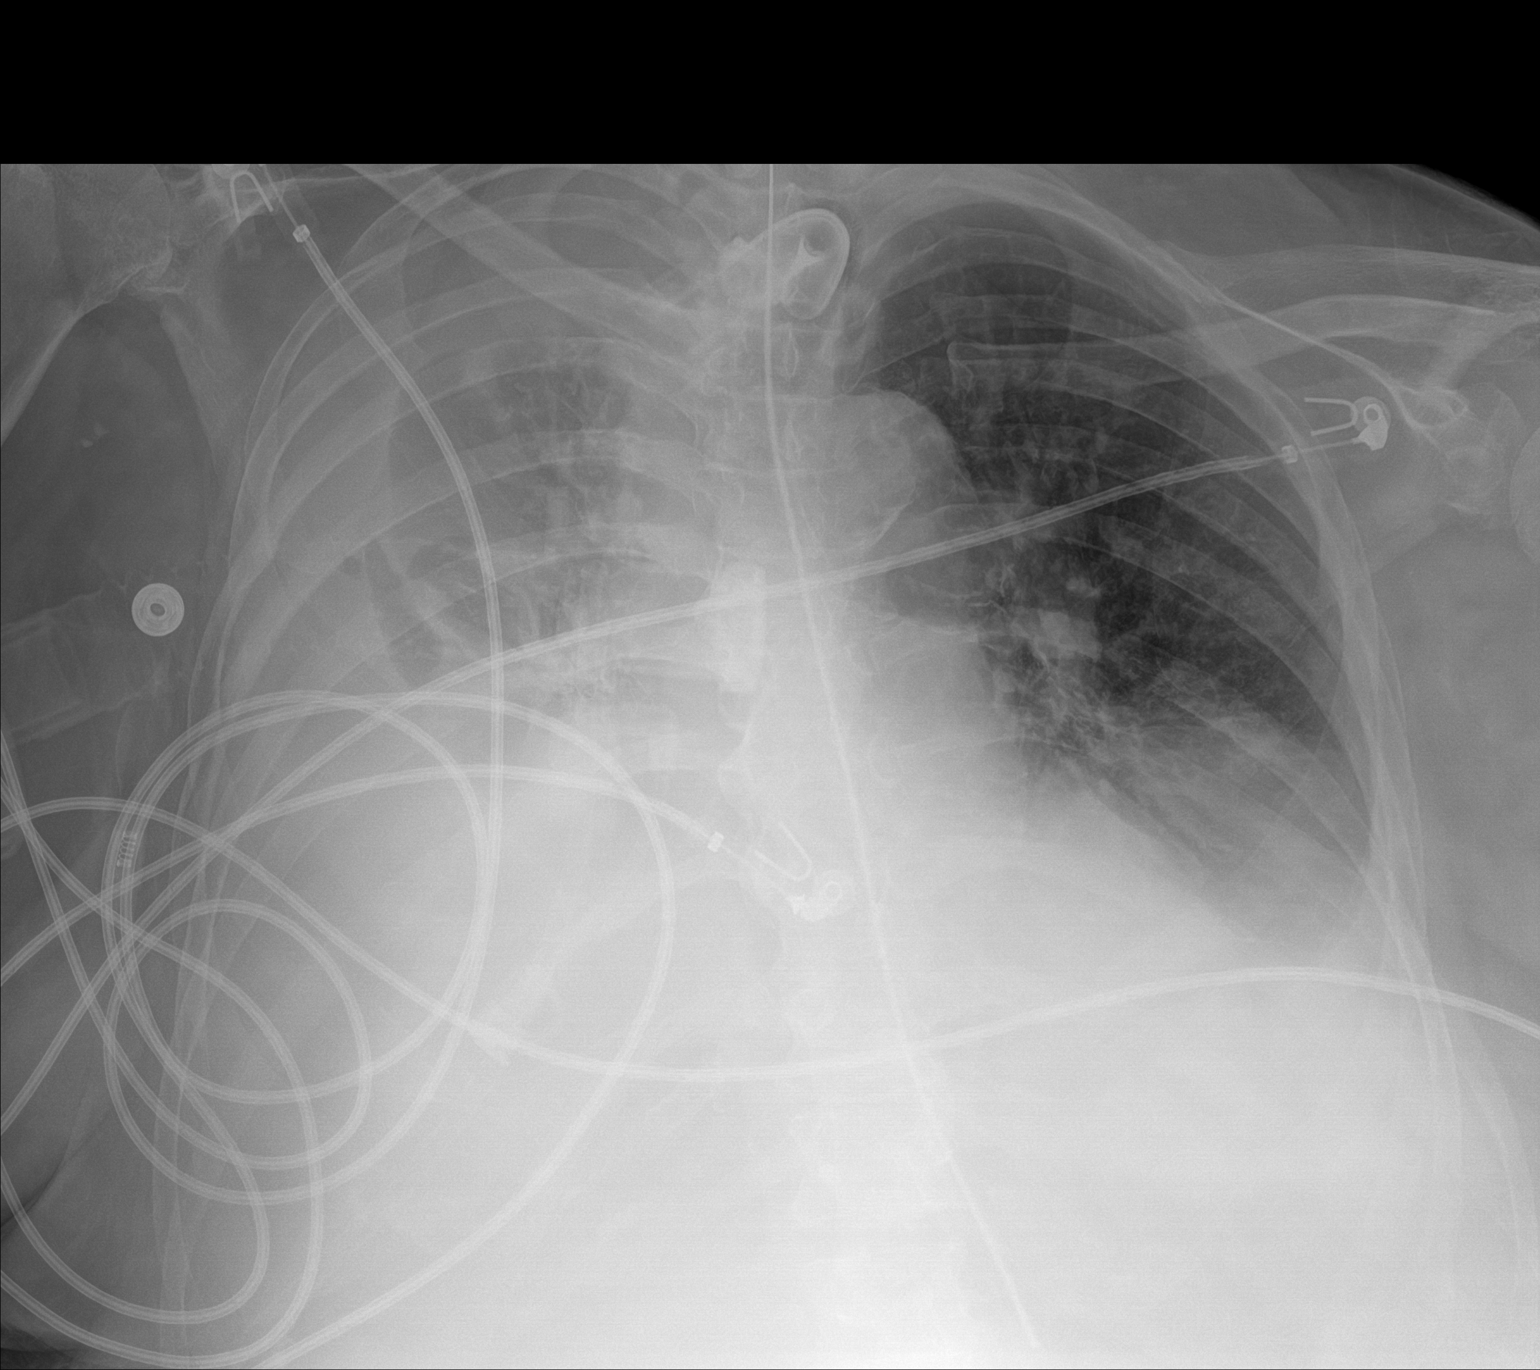

[1 of 1 positions shown; findings below may reference images not displayed]

FINDINGS: Tracheostomy tube appears appropriately positioned in the midline.
Enteric tube passes below the diaphragm.

Heart size is grossly stable. Near complete opacification of the
RIGHT hemithorax, largest component likely due to a large pleural
effusion, presumably reaccumulated status post thoracentesis on
08/16/2020. Probable smaller pleural effusion and/or atelectasis at
the LEFT lung base. No pneumothorax is seen.
IMPRESSION: 1. Large RIGHT pleural effusion, presumably reaccumulated status
post thoracentesis on 08/16/2020
2. Probable smaller pleural effusion and/or atelectasis at the LEFT
lung base.
